# Patient Record
Sex: Female | Born: 1985 | Race: White | Hispanic: No | Smoking: Former smoker
Health system: Southern US, Community
[De-identification: ages and names within clinical notes are randomized; demographics above are authoritative.]

## PROBLEM LIST (undated history)

## (undated) DIAGNOSIS — H905 Unspecified sensorineural hearing loss: Secondary | ICD-10-CM

## (undated) DIAGNOSIS — F909 Attention-deficit hyperactivity disorder, unspecified type: Secondary | ICD-10-CM

## (undated) DIAGNOSIS — F329 Major depressive disorder, single episode, unspecified: Secondary | ICD-10-CM

## (undated) DIAGNOSIS — F32A Depression, unspecified: Secondary | ICD-10-CM

## (undated) HISTORY — DX: Major depressive disorder, single episode, unspecified: F32.9

## (undated) HISTORY — PX: KIDNEY SURGERY: SHX687

## (undated) HISTORY — DX: Depression, unspecified: F32.A

---
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
## 2005-01-17 ENCOUNTER — Emergency Department (HOSPITAL_COMMUNITY): Admission: EM | Admit: 2005-01-17 | Discharge: 2005-01-17 | Payer: Self-pay | Admitting: Emergency Medicine

## 2005-12-04 ENCOUNTER — Ambulatory Visit (HOSPITAL_COMMUNITY): Admission: RE | Admit: 2005-12-04 | Discharge: 2005-12-04 | Payer: Self-pay | Admitting: Orthopaedic Surgery

## 2005-12-26 ENCOUNTER — Other Ambulatory Visit: Admission: RE | Admit: 2005-12-26 | Discharge: 2005-12-26 | Payer: Self-pay | Admitting: Gynecology

## 2006-07-09 ENCOUNTER — Inpatient Hospital Stay (HOSPITAL_COMMUNITY): Admission: AD | Admit: 2006-07-09 | Discharge: 2006-07-09 | Payer: Self-pay | Admitting: Obstetrics and Gynecology

## 2006-07-10 ENCOUNTER — Inpatient Hospital Stay (HOSPITAL_COMMUNITY): Admission: AD | Admit: 2006-07-10 | Discharge: 2006-07-14 | Payer: Self-pay | Admitting: Obstetrics and Gynecology

## 2007-01-24 IMAGING — RF DG FL GUIDE NDL PLMT  - NRPT [facility]
1 series · 1 of 1 positions shown · IV contrast (gadolinium)
Comparison: None.

<!--  IDXRADR:ADDEND:BEGIN -->Addendum Begins<!--  IDXRADR:ADDEND:INNER_BEGIN -->Original report by Dr. Kimo.  Following addendum by Dr. Kimo on 12/04/05:
 ADDENDUM:  I inadvertently stated that the contrast injection was into the right shoulder.  It was indeed into the patient?s left shoulder which is the shoulder that was dislocated at the time of her injury.  This was confirmed by asking the patient which shoulder was injured immediately prior to the contrast injection.  Therefore, each place where I stated right shoulder should indeed be left shoulder.  

 <!--  IDXRADR:ADDEND:INNER_END -->Addendum Ends
<!--  IDXRADR:ADDEND:END -->Clinical Data: History of right shoulder dislocation when snowboarding
approximately one week ago.
RIGHT SHOULDER INJECTION PRIOR TO MRI 12/04/2005:
TECHNIQUE: Informed consent was obtained from the patient prior to the
procedure. Using the usual sterile technique and 1% lidocaine as local
anesthetic, a 22-gauge needle was advanced into the right shoulder joint under
fluoroscopic guidance. A mixture of 0.1 cc gadolinium with 10 cc 1mnipaque-2YY
were administered into the joint. Spot film image was obtained for
documentation.

[Series 1: run · 1 of 1 slices shown]
[im 1/1]
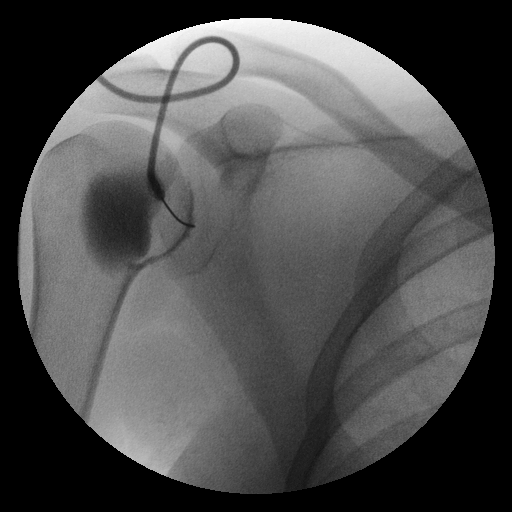

[1 of 1 positions shown; findings below may reference images not displayed]

IMPRESSION: Right shoulder joint contrast injection prior to MRI.

## 2015-03-31 ENCOUNTER — Emergency Department (HOSPITAL_BASED_OUTPATIENT_CLINIC_OR_DEPARTMENT_OTHER)
Admission: EM | Admit: 2015-03-31 | Discharge: 2015-03-31 | Disposition: A | Discharge status: Home / Self Care | Payer: BLUE CROSS/BLUE SHIELD | Attending: Emergency Medicine | Admitting: Emergency Medicine

## 2015-03-31 ENCOUNTER — Encounter (HOSPITAL_BASED_OUTPATIENT_CLINIC_OR_DEPARTMENT_OTHER): Payer: Self-pay

## 2015-03-31 ENCOUNTER — Emergency Department (HOSPITAL_BASED_OUTPATIENT_CLINIC_OR_DEPARTMENT_OTHER): Payer: BLUE CROSS/BLUE SHIELD

## 2015-03-31 DIAGNOSIS — R52 Pain, unspecified: Secondary | ICD-10-CM

## 2015-03-31 DIAGNOSIS — Z720 Tobacco use: Secondary | ICD-10-CM | POA: Insufficient documentation

## 2015-03-31 DIAGNOSIS — Z3202 Encounter for pregnancy test, result negative: Secondary | ICD-10-CM | POA: Insufficient documentation

## 2015-03-31 DIAGNOSIS — N8320 Unspecified ovarian cysts: Secondary | ICD-10-CM | POA: Insufficient documentation

## 2015-03-31 DIAGNOSIS — N939 Abnormal uterine and vaginal bleeding, unspecified: Secondary | ICD-10-CM | POA: Diagnosis not present

## 2015-03-31 DIAGNOSIS — F909 Attention-deficit hyperactivity disorder, unspecified type: Secondary | ICD-10-CM | POA: Insufficient documentation

## 2015-03-31 DIAGNOSIS — N83201 Unspecified ovarian cyst, right side: Secondary | ICD-10-CM

## 2015-03-31 DIAGNOSIS — N83209 Unspecified ovarian cyst, unspecified side: Secondary | ICD-10-CM

## 2015-03-31 DIAGNOSIS — R1031 Right lower quadrant pain: Secondary | ICD-10-CM | POA: Diagnosis present

## 2015-03-31 HISTORY — DX: Attention-deficit hyperactivity disorder, unspecified type: F90.9

## 2015-03-31 LAB — COMPREHENSIVE METABOLIC PANEL
ALT: 18 U/L (ref 14–54)
AST: 26 U/L (ref 15–41)
Albumin: 4.4 g/dL (ref 3.5–5.0)
Alkaline Phosphatase: 71 U/L (ref 38–126)
Anion gap: 10 (ref 5–15)
BUN: 13 mg/dL (ref 6–20)
CO2: 24 mmol/L (ref 22–32)
Calcium: 9.5 mg/dL (ref 8.9–10.3)
Chloride: 103 mmol/L (ref 101–111)
Creatinine, Ser: 0.81 mg/dL (ref 0.44–1.00)
GFR calc Af Amer: >60 mL/min (ref >60)
GFR calc non Af Amer: >60 mL/min (ref >60)
Glucose, Bld: 99 mg/dL (ref 65–99)
Potassium: 3.7 mmol/L (ref 3.5–5.1)
Sodium: 137 mmol/L (ref 135–145)
Total Bilirubin: 0.4 mg/dL (ref 0.3–1.2)
Total Protein: 8.1 g/dL (ref 6.5–8.1)

## 2015-03-31 LAB — URINALYSIS, ROUTINE W REFLEX MICROSCOPIC
Bilirubin Urine: NEGATIVE
Glucose, UA: NEGATIVE mg/dL
Ketones, ur: NEGATIVE mg/dL
Leukocytes, UA: NEGATIVE
Nitrite: NEGATIVE
Protein, ur: NEGATIVE mg/dL
Specific Gravity, Urine: 1.025 (ref 1.005–1.030)
Urobilinogen, UA: 0.2 mg/dL (ref 0.0–1.0)
pH: 5.5 (ref 5.0–8.0)

## 2015-03-31 LAB — WET PREP, GENITAL
Trich, Wet Prep: NONE SEEN
Yeast Wet Prep HPF POC: NONE SEEN

## 2015-03-31 LAB — URINE MICROSCOPIC-ADD ON

## 2015-03-31 LAB — CBC
HCT: 40.8 % (ref 36.0–46.0)
Hemoglobin: 13.7 g/dL (ref 12.0–15.0)
MCH: 28.2 pg (ref 26.0–34.0)
MCHC: 33.6 g/dL (ref 30.0–36.0)
MCV: 84 fL (ref 78.0–100.0)
Platelets: 293 10*3/uL (ref 150–400)
RBC: 4.86 MIL/uL (ref 3.87–5.11)
RDW: 13.3 % (ref 11.5–15.5)
WBC: 6.4 10*3/uL (ref 4.0–10.5)

## 2015-03-31 LAB — LIPASE, BLOOD: Lipase: 45 U/L (ref 22–51)

## 2015-03-31 LAB — PREGNANCY, URINE: Preg Test, Ur: NEGATIVE

## 2015-03-31 NOTE — ED Provider Notes (Signed)
CSN: [id number]     Arrival date & time 03/31/15  1336 History   First MD Initiated Contact with Patient 03/31/15 1400     Chief Complaint  Patient presents with  . Abdominal Pain     (Consider location/radiation/quality/duration/timing/severity/associated sxs/prior Treatment) HPI Comments: 29 year old female complaining of gradual onset right lower quadrant pain beginning earlier this morning while she was laying in bed. Pain has remained constant, worse when she relieves pressure from the area or moves in certain ways. No alleviating factors. Reports yesterday while playing volleyball, she was experiencing bilateral side pain which has since eased off. Admits to nausea without vomiting. Had an episode of loose stool earlier today. Reports a small amount of dark, red blood vaginal spotting beginning yesterday despite LMP 03/18/2015, only lasting a few days. Denies fevers or any urinary symptoms. Family history of Crohn's and kidney stones. Sexually active with her husband, not on birth control.  Patient is a 29 y.o. female presenting with abdominal pain. The history is provided by the patient and the spouse.  Abdominal Pain Associated symptoms: nausea and vaginal bleeding     Past Medical History  Diagnosis Date  . ADHD (attention deficit hyperactivity disorder)    Past Surgical History  Procedure Laterality Date  . Cesarean section     No family history on file. History  Substance Use Topics  . Smoking status: Current Every Day Smoker  . Smokeless tobacco: Not on file  . Alcohol Use: Yes   OB History    No data available     Review of Systems  Gastrointestinal: Positive for nausea and abdominal pain.       + Loose stool.  Genitourinary: Positive for vaginal bleeding.  Musculoskeletal:       + BL side pain.  All other systems reviewed and are negative.     Allergies  Erythromycin  Home Medications   Prior to Admission medications   Medication Sig Start Date End  Date Taking? Authorizing Provider  Amphetamine-Dextroamphetamine (ADDERALL PO) Take by mouth.   Yes Historical Provider, MD   BP 114/69 mmHg  Pulse 72  Temp(Src) 98.1 F (36.7 C) (Oral)  Resp 18  Ht 5\' 7"  (1.702 m)  Wt 180 lb (81.647 kg)  BMI 28.19 kg/m2  SpO2 98%  LMP 03/18/2015 Physical Exam  Constitutional: She is oriented to person, place, and time. She appears well-developed and well-nourished. No distress.  HENT:  Head: Normocephalic and atraumatic.  Mouth/Throat: Oropharynx is clear and moist.  Eyes: Conjunctivae and EOM are normal.  Neck: Normal range of motion. Neck supple.  Cardiovascular: Normal rate, regular rhythm and normal heart sounds.   Pulmonary/Chest: Effort normal and breath sounds normal. No respiratory distress.  Abdominal: Soft. Normal appearance and bowel sounds are normal. She exhibits no distension. There is tenderness in the right upper quadrant, right lower quadrant and epigastric area. There is guarding. There is no rigidity, no rebound, no CVA tenderness, no tenderness at McBurney's point and negative Murphy's sign.  Tenderness moreso RLQ.  Genitourinary: Uterus is tender. Cervix exhibits no motion tenderness, no discharge and no friability. Right adnexum displays tenderness. Left adnexum displays no tenderness. There is bleeding in the vagina. No erythema or tenderness in the vagina. No vaginal discharge found.  Musculoskeletal: Normal range of motion. She exhibits no edema.  Neurological: She is alert and oriented to person, place, and time. No sensory deficit.  Skin: Skin is warm and dry.  Psychiatric: She has a normal mood and affect.  Her behavior is normal.  Nursing note and vitals reviewed.   ED Course  Procedures (including critical care time) Labs Review Labs Reviewed  WET PREP, GENITAL - Abnormal; Notable for the following:    Clue Cells Wet Prep HPF POC FEW (*)    WBC, Wet Prep HPF POC FEW (*)    All other components within normal limits   URINALYSIS, ROUTINE W REFLEX MICROSCOPIC (NOT AT [facility]) - Abnormal; Notable for the following:    Hgb urine dipstick LARGE (*)    All other components within normal limits  URINE MICROSCOPIC-ADD ON - Abnormal; Notable for the following:    Bacteria, UA MANY (*)    All other components within normal limits  PREGNANCY, URINE  CBC  COMPREHENSIVE METABOLIC PANEL  LIPASE, BLOOD  GC/CHLAMYDIA PROBE AMP ([place]) NOT AT [facility]    Imaging Review US Transvaginal Non-ob  03/31/2015   CLINICAL DATA:  Right lower quadrant pain since 6 a.m. Intermittent vaginal bleeding. Nausea.  EXAM: TRANSABDOMINAL AND TRANSVAGINAL ULTRASOUND OF PELVIS  TECHNIQUE: Both transabdominal and transvaginal ultrasound examinations of the pelvis were performed. Transabdominal technique was performed for global imaging of the pelvis including uterus, ovaries, adnexal regions, and pelvic cul-de-sac. It was necessary to proceed with endovaginal exam following the transabdominal exam to visualize the uterus, ovaries, and adnexa.  COMPARISON:  None  FINDINGS: Uterus  Measurements: 8.2 x 4.4 x 5.8 cm. Retroverted. Otherwise normal in morphology. Incidental note is made of a 1.4 cm nabothian cyst.  Endometrium  Thickness: Normal, 16 mm.  Right ovary  Measurements: 3.8 x 3.0 x 2.4 cm. cystic lesion within is minimally irregular, measuring 2.1 x 1.8 x 1.6 cm. Has a suggestion of peripheral hypervascularity on image 67.  Left ovary  Measurements: 4.2 x 1.2 x 1.8 cm.  Normal in morphology.  Other findings  Small amount of fluid is identified adjacent the right ovary.  IMPRESSION: 1. 2.1 cm right ovarian lesion is likely a corpus luteal cyst. Fluid adjacent the right ovary suggests recent cyst rupture. 2. Otherwise, normal pelvic ultrasound for age.   Electronically Signed   By: Jeronimo Greaves M.D.   On: 03/31/2015 16:57   US Pelvis Complete  03/31/2015   CLINICAL DATA:  Right lower quadrant pain since 6 a.m. Intermittent vaginal bleeding.  Nausea.  EXAM: TRANSABDOMINAL AND TRANSVAGINAL ULTRASOUND OF PELVIS  TECHNIQUE: Both transabdominal and transvaginal ultrasound examinations of the pelvis were performed. Transabdominal technique was performed for global imaging of the pelvis including uterus, ovaries, adnexal regions, and pelvic cul-de-sac. It was necessary to proceed with endovaginal exam following the transabdominal exam to visualize the uterus, ovaries, and adnexa.  COMPARISON:  None  FINDINGS: Uterus  Measurements: 8.2 x 4.4 x 5.8 cm. Retroverted. Otherwise normal in morphology. Incidental note is made of a 1.4 cm nabothian cyst.  Endometrium  Thickness: Normal, 16 mm.  Right ovary  Measurements: 3.8 x 3.0 x 2.4 cm. cystic lesion within is minimally irregular, measuring 2.1 x 1.8 x 1.6 cm. Has a suggestion of peripheral hypervascularity on image 67.  Left ovary  Measurements: 4.2 x 1.2 x 1.8 cm.  Normal in morphology.  Other findings  Small amount of fluid is identified adjacent the right ovary.  IMPRESSION: 1. 2.1 cm right ovarian lesion is likely a corpus luteal cyst. Fluid adjacent the right ovary suggests recent cyst rupture. 2. Otherwise, normal pelvic ultrasound for age.   Electronically Signed   By: Jeronimo Greaves M.D.   On: 03/31/2015 16:57  EKG Interpretation None      MDM   Final diagnoses:  Ruptured ovarian cyst  Right ovarian cyst  Vaginal bleeding   Nontoxic appearing, NAD. AF VSS. Abdomen is soft with no peritoneal signs. Tenderness in right lower quadrant, right upper quadrant and epigastric. Negative Murphy's sign. Labs normal. UA with large amount of blood, however she has vaginal bleeding. No CMT. Uterine and right adnexal tenderness. Pelvic ultrasound obtained to evaluate the right adnexal pain. Right ovarian cyst and free fluid adjacent to the right ovary suggesting recent cyst rupture. This is the most likely cause of patient's pain. Patient is refusing any medication in the ED as her pain is slowly easing  off. Doubt appendicitis. I advised follow-up with her OB/GYN. Stable for discharge. Return precautions given. Patient states understanding of treatment care plan and is agreeable.  Kathrynn Speed, PA-C 03/31/15 1725  Purvis Sheffield, MD 04/01/15 [phone]

## 2015-03-31 NOTE — ED Notes (Addendum)
RLQ pain started this am-nausea-diarrhea yesterday-LMP 5/14 however vaginal bleeding started again yesterday

## 2015-03-31 NOTE — Discharge Instructions (Signed)
Follow up with your ob/gyn.  Ovarian Cyst An ovarian cyst is a fluid-filled sac that forms on an ovary. The ovaries are small organs that produce eggs in women. Various types of cysts can form on the ovaries. Most are not cancerous. Many do not cause problems, and they often go away on their own. Some may cause symptoms and require treatment. Common types of ovarian cysts include:  Functional cysts--These cysts may occur every month during the menstrual cycle. This is normal. The cysts usually go away with the next menstrual cycle if the woman does not get pregnant. Usually, there are no symptoms with a functional cyst.  Endometrioma cysts--These cysts form from the tissue that lines the uterus. They are also called "chocolate cysts" because they become filled with blood that turns brown. This type of cyst can cause pain in the lower abdomen during intercourse and with your menstrual period.  Cystadenoma cysts--This type develops from the cells on the outside of the ovary. These cysts can get very big and cause lower abdomen pain and pain with intercourse. This type of cyst can twist on itself, cut off its blood supply, and cause severe pain. It can also easily rupture and cause a lot of pain.  Dermoid cysts--This type of cyst is sometimes found in both ovaries. These cysts may contain different kinds of body tissue, such as skin, teeth, hair, or cartilage. They usually do not cause symptoms unless they get very big.  Theca lutein cysts--These cysts occur when too much of a certain hormone (human chorionic gonadotropin) is produced and overstimulates the ovaries to produce an egg. This is most common after procedures used to assist with the conception of a baby (in vitro fertilization). CAUSES   Fertility drugs can cause a condition in which multiple large cysts are formed on the ovaries. This is called ovarian hyperstimulation syndrome.  A condition called polycystic ovary syndrome can cause  hormonal imbalances that can lead to nonfunctional ovarian cysts. SIGNS AND SYMPTOMS  Many ovarian cysts do not cause symptoms. If symptoms are present, they may include:  Pelvic pain or pressure.  Pain in the lower abdomen.  Pain during sexual intercourse.  Increasing girth (swelling) of the abdomen.  Abnormal menstrual periods.  Increasing pain with menstrual periods.  Stopping having menstrual periods without being pregnant. DIAGNOSIS  These cysts are commonly found during a routine or annual pelvic exam. Tests may be ordered to find out more about the cyst. These tests may include:  Ultrasound.  X-ray of the pelvis.  CT scan.  MRI.  Blood tests. TREATMENT  Many ovarian cysts go away on their own without treatment. Your health care provider may want to check your cyst regularly for 2-3 months to see if it changes. For women in menopause, it is particularly important to monitor a cyst closely because of the higher rate of ovarian cancer in menopausal women. When treatment is needed, it may include any of the following:  A procedure to drain the cyst (aspiration). This may be done using a long needle and ultrasound. It can also be done through a laparoscopic procedure. This involves using a thin, lighted tube with a tiny camera on the end (laparoscope) inserted through a small incision.  Surgery to remove the whole cyst. This may be done using laparoscopic surgery or an open surgery involving a larger incision in the lower abdomen.  Hormone treatment or birth control pills. These methods are sometimes used to help dissolve a cyst. HOME CARE  INSTRUCTIONS   Only take over-the-counter or prescription medicines as directed by your health care provider.  Follow up with your health care provider as directed.  Get regular pelvic exams and Pap tests. SEEK MEDICAL CARE IF:   Your periods are late, irregular, or painful, or they stop.  Your pelvic pain or abdominal pain does  not go away.  Your abdomen becomes larger or swollen.  You have pressure on your bladder or trouble emptying your bladder completely.  You have pain during sexual intercourse.  You have feelings of fullness, pressure, or discomfort in your stomach.  You lose weight for no apparent reason.  You feel generally ill.  You become constipated.  You lose your appetite.  You develop acne.  You have an increase in body and facial hair.  You are gaining weight, without changing your exercise and eating habits.  You think you are pregnant. SEEK IMMEDIATE MEDICAL CARE IF:   You have increasing abdominal pain.  You feel sick to your stomach (nauseous), and you throw up (vomit).  You develop a fever that comes on suddenly.  You have abdominal pain during a bowel movement.  Your menstrual periods become heavier than usual. MAKE SURE YOU:  Understand these instructions.  Will watch your condition.  Will get help right away if you are not doing well or get worse. Document Released: 10/21/2005 Document Revised: 10/26/2013 Document Reviewed: 06/28/2013 [facility] Patient Information 2015 [place], [place]. This information is not intended to replace advice given to you by your health care provider. Make sure you discuss any questions you have with your health care provider.  Abnormal Uterine Bleeding Abnormal uterine bleeding can affect women at various stages in life, including teenagers, women in their reproductive years, pregnant women, and women who have reached menopause. Several kinds of uterine bleeding are considered abnormal, including:  Bleeding or spotting between periods.   Bleeding after sexual intercourse.   Bleeding that is heavier or more than normal.   Periods that last longer than usual.  Bleeding after menopause.  Many cases of abnormal uterine bleeding are minor and simple to treat, while others are more serious. Any type of abnormal bleeding should be  evaluated by your health care provider. Treatment will depend on the cause of the bleeding. HOME CARE INSTRUCTIONS Monitor your condition for any changes. The following actions may help to alleviate any discomfort you are experiencing:  Avoid the use of tampons and douches as directed by your health care provider.  Change your pads frequently. You should get regular pelvic exams and Pap tests. Keep all follow-up appointments for diagnostic tests as directed by your health care provider.  SEEK MEDICAL CARE IF:   Your bleeding lasts more than 1 week.   You feel dizzy at times.  SEEK IMMEDIATE MEDICAL CARE IF:   You pass out.   You are changing pads every 15 to 30 minutes.   You have abdominal pain.  You have a fever.   You become sweaty or weak.   You are passing large blood clots from the vagina.   You start to feel nauseous and vomit. MAKE SURE YOU:   Understand these instructions.  Will watch your condition.  Will get help right away if you are not doing well or get worse. Document Released: 10/21/2005 Document Revised: 10/26/2013 Document Reviewed: 05/20/2013 [facility]ExitCare Patient Information 2015 [place]. This information is not intended to replace advice given to you by your health care provider. Make sure you discuss any  questions you have with your health care provider. ° °

## 2015-04-04 LAB — GC/CHLAMYDIA PROBE AMP (~~LOC~~) NOT AT ARMC
Chlamydia: NEGATIVE
Neisseria Gonorrhea: NEGATIVE

## 2016-02-09 DIAGNOSIS — B351 Tinea unguium: Secondary | ICD-10-CM | POA: Diagnosis not present

## 2016-02-09 DIAGNOSIS — F900 Attention-deficit hyperactivity disorder, predominantly inattentive type: Secondary | ICD-10-CM | POA: Diagnosis not present

## 2016-04-09 DIAGNOSIS — Z130 Encounter for screening for diseases of the blood and blood-forming organs and certain disorders involving the immune mechanism: Secondary | ICD-10-CM | POA: Diagnosis not present

## 2016-04-09 DIAGNOSIS — Z1389 Encounter for screening for other disorder: Secondary | ICD-10-CM | POA: Diagnosis not present

## 2016-04-09 DIAGNOSIS — Z01419 Encounter for gynecological examination (general) (routine) without abnormal findings: Secondary | ICD-10-CM | POA: Diagnosis not present

## 2016-04-09 DIAGNOSIS — Z1151 Encounter for screening for human papillomavirus (HPV): Secondary | ICD-10-CM | POA: Diagnosis not present

## 2016-04-09 DIAGNOSIS — Z113 Encounter for screening for infections with a predominantly sexual mode of transmission: Secondary | ICD-10-CM | POA: Diagnosis not present

## 2016-04-09 DIAGNOSIS — Z124 Encounter for screening for malignant neoplasm of cervix: Secondary | ICD-10-CM | POA: Diagnosis not present

## 2016-04-09 DIAGNOSIS — Z6830 Body mass index (BMI) 30.0-30.9, adult: Secondary | ICD-10-CM | POA: Diagnosis not present

## 2016-04-09 DIAGNOSIS — F909 Attention-deficit hyperactivity disorder, unspecified type: Secondary | ICD-10-CM | POA: Insufficient documentation

## 2016-05-20 IMAGING — US US PELVIS COMPLETE
1 series · 13 of 25 positions shown · non-contrast
Comparison: None

CLINICAL DATA: Right lower quadrant pain since 6 a.m.. Intermittent
vaginal bleeding. Nausea.

EXAM:
TRANSABDOMINAL AND TRANSVAGINAL ULTRASOUND OF PELVIS
TECHNIQUE: Both transabdominal and transvaginal ultrasound examinations of the
pelvis were performed. Transabdominal technique was performed for
global imaging of the pelvis including uterus, ovaries, adnexal
regions, and pelvic cul-de-sac. It was necessary to proceed with
endovaginal exam following the transabdominal exam to visualize the
uterus, ovaries, and adnexa.

[Series 1: us pelvis complete · 0.22mm/px · 13 of 92 slices shown]
[im 1/92]
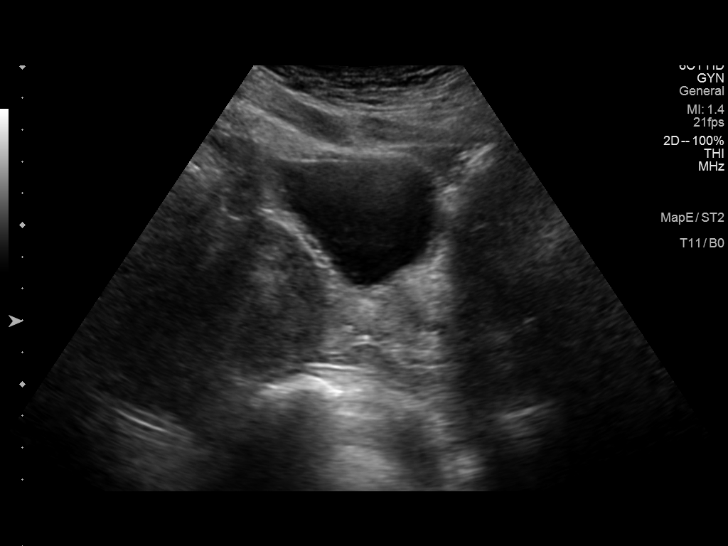
[im 8/92]
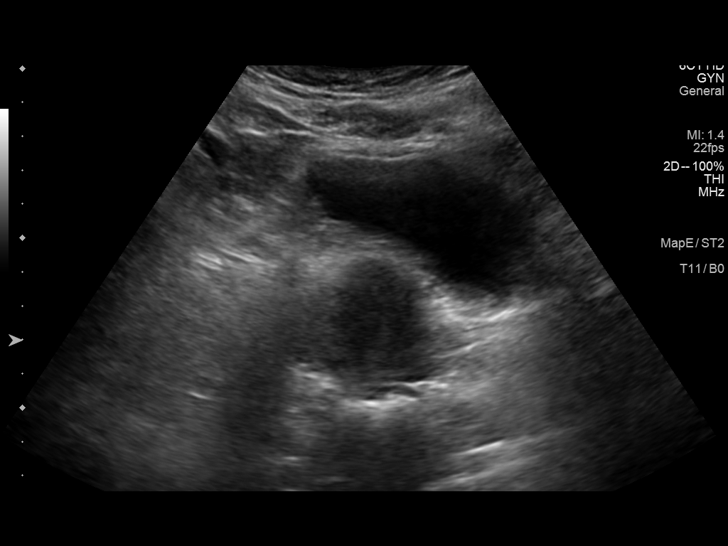
[im 16/92]
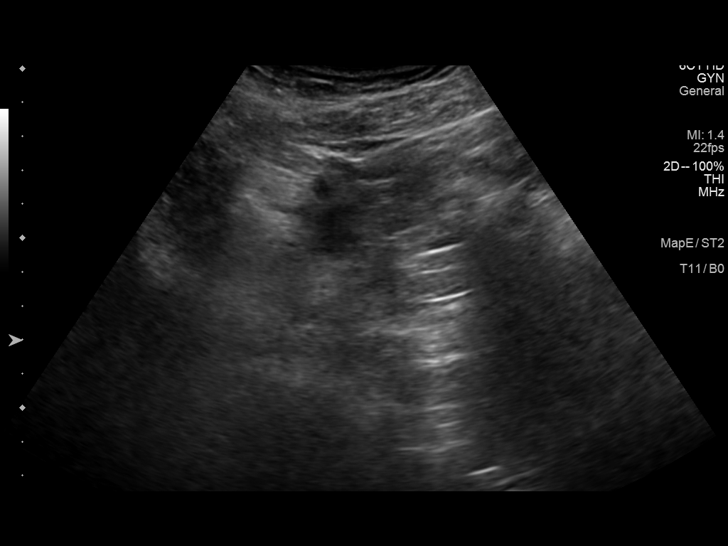
[im 23/92]
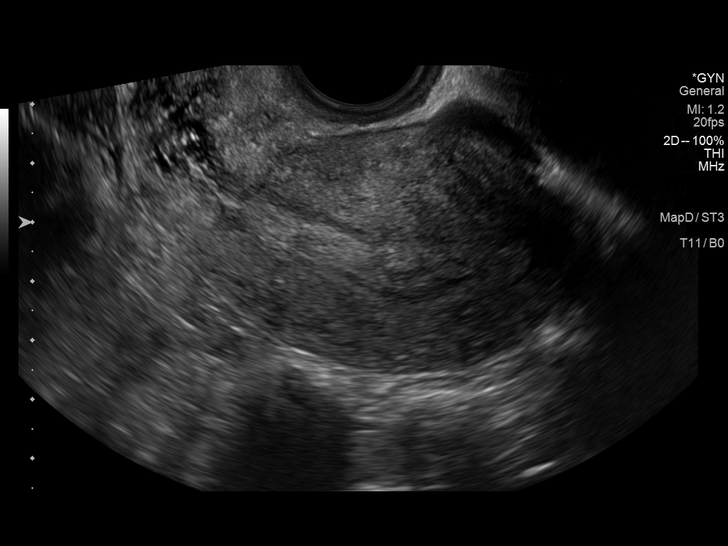
[im 31/92]
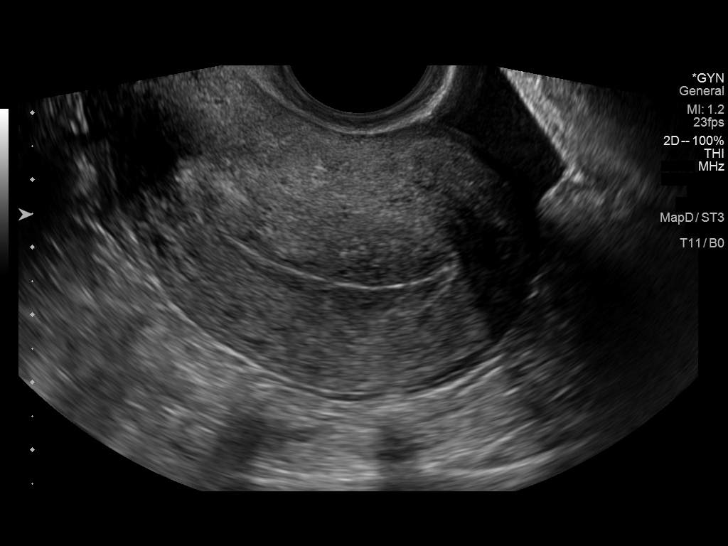
[im 38/92]
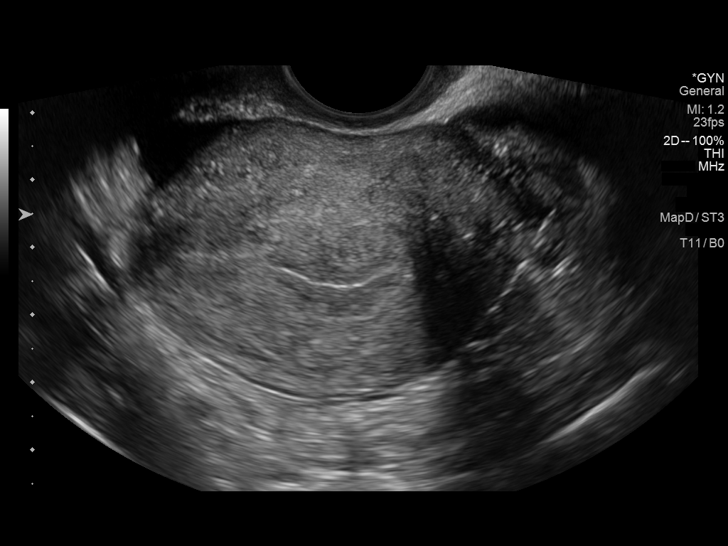
[im 46/92]
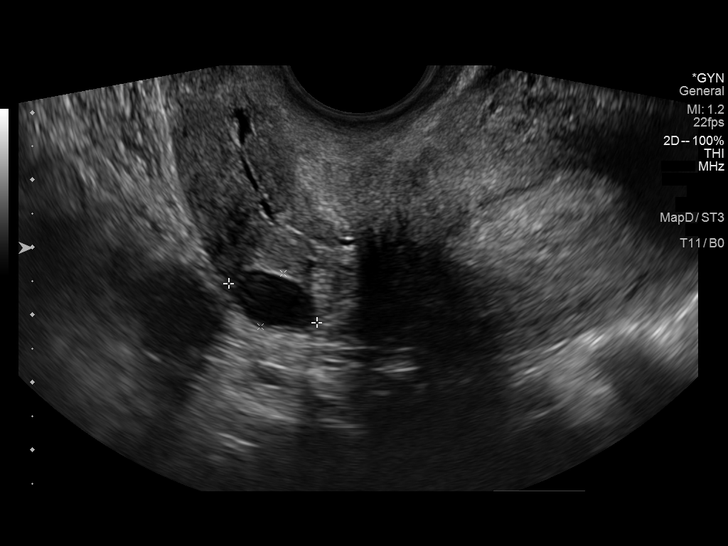
[im 54/92]
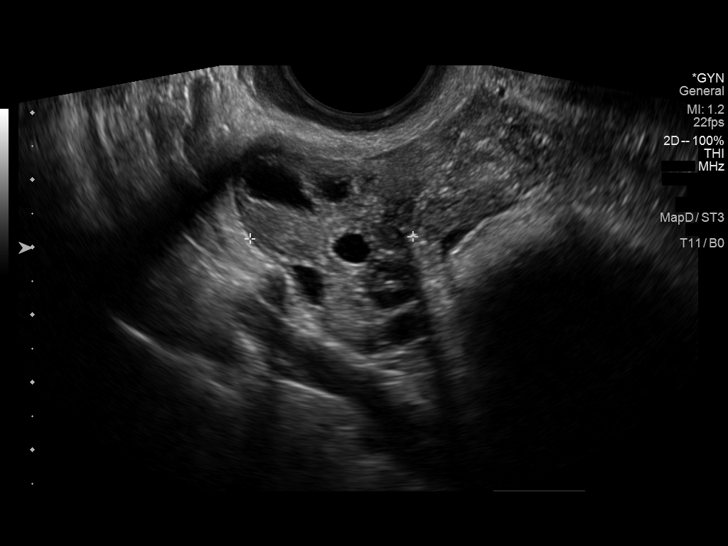
[im 61/92]
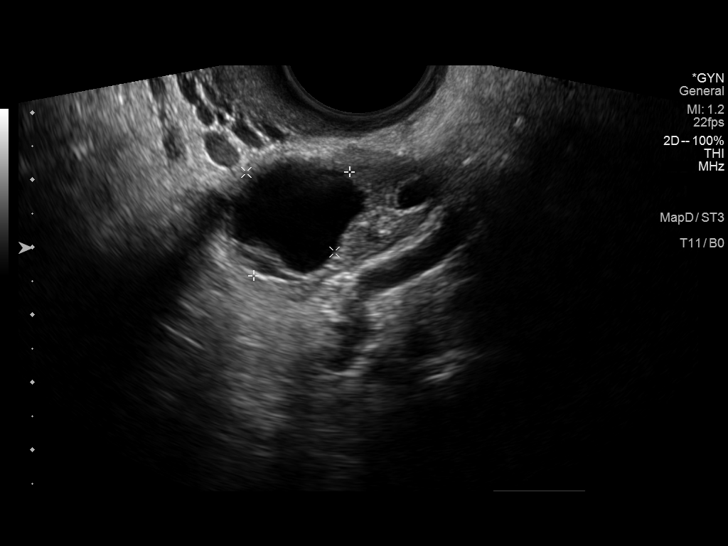
[im 69/92]
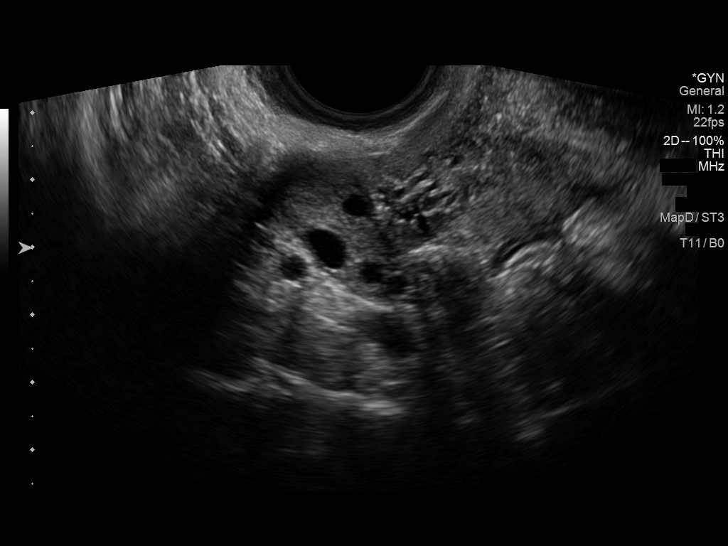
[im 76/92]
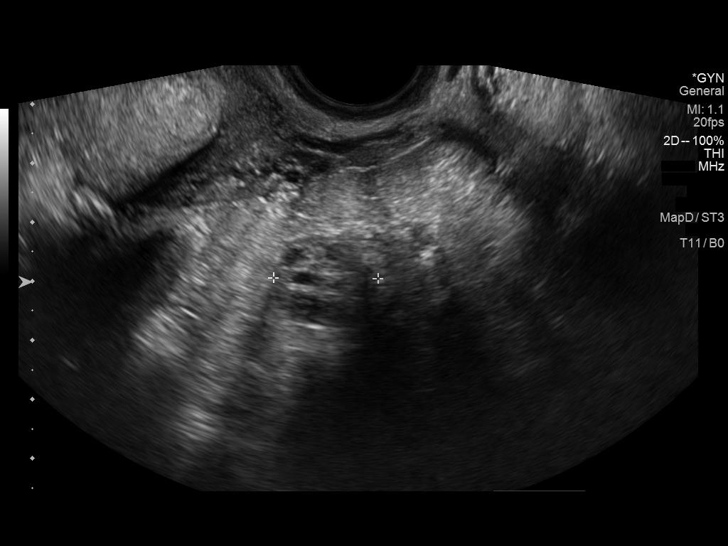
[im 84/92]
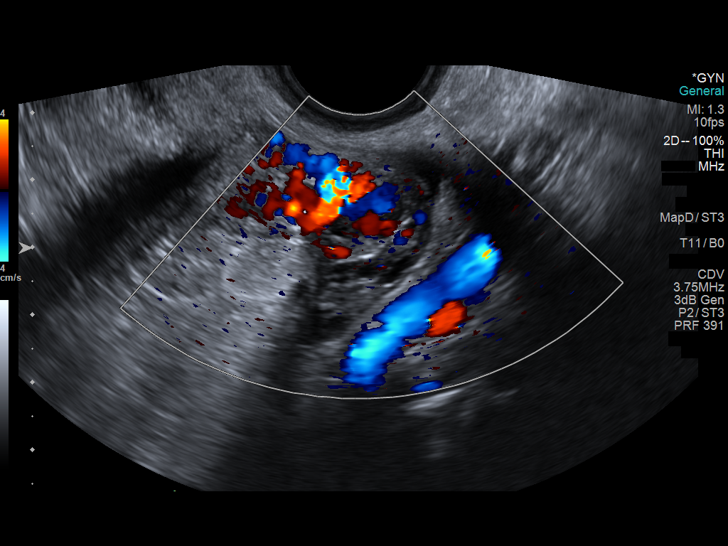
[im 92/92]
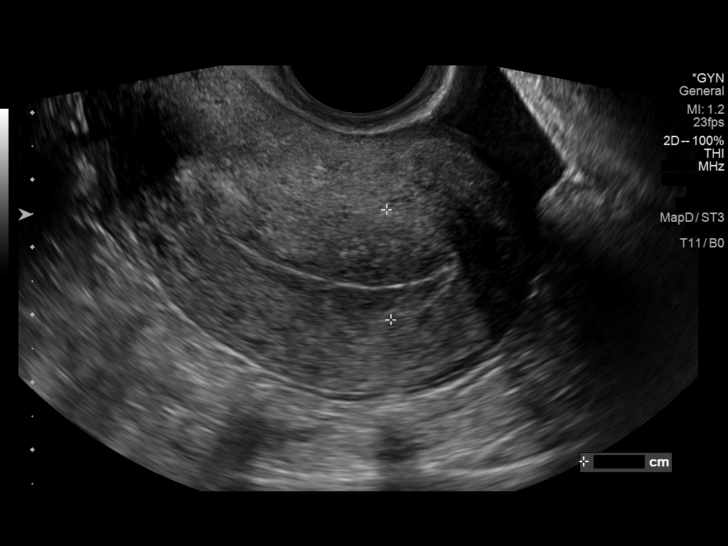

[13 of 25 positions shown; findings below may reference images not displayed]

FINDINGS: Uterus

Measurements: 8.2 x 4.4 x 5.8 cm.. Retroverted. Otherwise normal in
morphology. Incidental note is made of a 1.4 cm nabothian cyst.

Endometrium

Thickness: Normal, 16 mm.

Right ovary

Measurements: 3.8 x 3.0 x 2.4 cm. cystic lesion within is minimally
irregular, measuring 2.1 x 1.8 x 1.6 cm. Has a suggestion of
peripheral hypervascularity on image 67.

Left ovary

Measurements: 4.2 x 1.2 x 1.8 cm.  Normal in morphology.

Other findings

Small amount of fluid is identified adjacent the right ovary.
IMPRESSION: 1. 2.1 cm right ovarian lesion is likely a corpus luteal cyst. Fluid
adjacent the right ovary suggests recent cyst rupture.
2. Otherwise, normal pelvic ultrasound for age.

## 2016-08-13 DIAGNOSIS — F900 Attention-deficit hyperactivity disorder, predominantly inattentive type: Secondary | ICD-10-CM | POA: Diagnosis not present

## 2016-10-02 DIAGNOSIS — Z3201 Encounter for pregnancy test, result positive: Secondary | ICD-10-CM | POA: Diagnosis not present

## 2016-10-02 DIAGNOSIS — N911 Secondary amenorrhea: Secondary | ICD-10-CM | POA: Diagnosis not present

## 2016-10-21 DIAGNOSIS — O34211 Maternal care for low transverse scar from previous cesarean delivery: Secondary | ICD-10-CM | POA: Diagnosis not present

## 2016-10-21 DIAGNOSIS — Z368A Encounter for antenatal screening for other genetic defects: Secondary | ICD-10-CM | POA: Diagnosis not present

## 2016-10-21 DIAGNOSIS — Z3A09 9 weeks gestation of pregnancy: Secondary | ICD-10-CM | POA: Diagnosis not present

## 2016-10-21 DIAGNOSIS — O26891 Other specified pregnancy related conditions, first trimester: Secondary | ICD-10-CM | POA: Diagnosis not present

## 2016-10-21 DIAGNOSIS — O3429 Maternal care due to uterine scar from other previous surgery: Secondary | ICD-10-CM | POA: Insufficient documentation

## 2016-10-21 DIAGNOSIS — Z3689 Encounter for other specified antenatal screening: Secondary | ICD-10-CM | POA: Diagnosis not present

## 2016-10-21 DIAGNOSIS — Z3481 Encounter for supervision of other normal pregnancy, first trimester: Secondary | ICD-10-CM | POA: Diagnosis not present

## 2016-10-21 LAB — OB RESULTS CONSOLE ANTIBODY SCREEN: Antibody Screen: NEGATIVE

## 2016-10-21 LAB — OB RESULTS CONSOLE GC/CHLAMYDIA
Chlamydia: NEGATIVE
Gonorrhea: NEGATIVE

## 2016-10-21 LAB — OB RESULTS CONSOLE HIV ANTIBODY (ROUTINE TESTING): HIV: NONREACTIVE

## 2016-10-21 LAB — OB RESULTS CONSOLE ABO/RH: RH Type: POSITIVE

## 2016-10-21 LAB — OB RESULTS CONSOLE RUBELLA ANTIBODY, IGM: Rubella: IMMUNE

## 2016-10-21 LAB — OB RESULTS CONSOLE HEPATITIS B SURFACE ANTIGEN: Hepatitis B Surface Ag: NEGATIVE

## 2016-10-21 LAB — OB RESULTS CONSOLE RPR: RPR: NONREACTIVE

## 2016-11-04 NOTE — L&D Delivery Note (Signed)
Delivery Note At 5:34 PM a viable female was delivered via VBAC, Spontaneous (Presentation: vtx; OA).  APGAR: 9, 9; weight pending.   Placenta status: spontaneous, trailing membranes removed with ring forceps.  Cord:  with the following complications: none.  Anesthesia: Epidural  Episiotomy: None Lacerations:  2nd degree vaginal Suture Repair: 3.0 vicryl rapide Est. Blood Loss (mL):  300cc  Mom to postpartum.  Baby to Couplet care / Skin to Skin.  Leighton Roachodd D Kristen Fromm 05/23/2017, 6:02 PM

## 2016-11-15 DIAGNOSIS — Z3A13 13 weeks gestation of pregnancy: Secondary | ICD-10-CM | POA: Diagnosis not present

## 2016-11-15 DIAGNOSIS — O34211 Maternal care for low transverse scar from previous cesarean delivery: Secondary | ICD-10-CM | POA: Diagnosis not present

## 2016-11-15 DIAGNOSIS — Z3682 Encounter for antenatal screening for nuchal translucency: Secondary | ICD-10-CM | POA: Diagnosis not present

## 2016-12-25 DIAGNOSIS — Z3A18 18 weeks gestation of pregnancy: Secondary | ICD-10-CM | POA: Diagnosis not present

## 2016-12-25 DIAGNOSIS — Z363 Encounter for antenatal screening for malformations: Secondary | ICD-10-CM | POA: Diagnosis not present

## 2016-12-25 DIAGNOSIS — O34211 Maternal care for low transverse scar from previous cesarean delivery: Secondary | ICD-10-CM | POA: Diagnosis not present

## 2016-12-25 DIAGNOSIS — Z23 Encounter for immunization: Secondary | ICD-10-CM | POA: Diagnosis not present

## 2017-02-13 DIAGNOSIS — F900 Attention-deficit hyperactivity disorder, predominantly inattentive type: Secondary | ICD-10-CM | POA: Diagnosis not present

## 2017-02-20 DIAGNOSIS — Z3689 Encounter for other specified antenatal screening: Secondary | ICD-10-CM | POA: Diagnosis not present

## 2017-02-20 DIAGNOSIS — Z6791 Unspecified blood type, Rh negative: Secondary | ICD-10-CM | POA: Diagnosis not present

## 2017-02-20 DIAGNOSIS — Z3482 Encounter for supervision of other normal pregnancy, second trimester: Secondary | ICD-10-CM | POA: Diagnosis not present

## 2017-03-10 DIAGNOSIS — Z23 Encounter for immunization: Secondary | ICD-10-CM | POA: Diagnosis not present

## 2017-03-10 DIAGNOSIS — Z3A29 29 weeks gestation of pregnancy: Secondary | ICD-10-CM | POA: Diagnosis not present

## 2017-04-30 DIAGNOSIS — Z3685 Encounter for antenatal screening for Streptococcus B: Secondary | ICD-10-CM | POA: Diagnosis not present

## 2017-05-01 LAB — OB RESULTS CONSOLE GBS: GBS: NEGATIVE

## 2017-05-21 ENCOUNTER — Telehealth (HOSPITAL_COMMUNITY): Payer: Self-pay | Admitting: *Deleted

## 2017-05-21 ENCOUNTER — Encounter (HOSPITAL_COMMUNITY): Payer: Self-pay | Admitting: *Deleted

## 2017-05-21 NOTE — Telephone Encounter (Signed)
Preadmission screen  

## 2017-05-23 ENCOUNTER — Inpatient Hospital Stay (HOSPITAL_COMMUNITY): Payer: BLUE CROSS/BLUE SHIELD

## 2017-05-23 ENCOUNTER — Inpatient Hospital Stay (HOSPITAL_COMMUNITY): Payer: BLUE CROSS/BLUE SHIELD | Admitting: Anesthesiology

## 2017-05-23 ENCOUNTER — Inpatient Hospital Stay (HOSPITAL_COMMUNITY)
Admission: AD | Admit: 2017-05-23 | Discharge: 2017-05-25 | DRG: 775 | Disposition: A | Discharge status: Home / Self Care | Payer: BLUE CROSS/BLUE SHIELD | Source: Ambulatory Visit | Attending: Obstetrics and Gynecology | Admitting: Obstetrics and Gynecology

## 2017-05-23 ENCOUNTER — Encounter (HOSPITAL_COMMUNITY): Payer: Self-pay

## 2017-05-23 DIAGNOSIS — O480 Post-term pregnancy: Secondary | ICD-10-CM | POA: Diagnosis not present

## 2017-05-23 DIAGNOSIS — Z3A40 40 weeks gestation of pregnancy: Secondary | ICD-10-CM | POA: Diagnosis not present

## 2017-05-23 DIAGNOSIS — O403XX0 Polyhydramnios, third trimester, not applicable or unspecified: Secondary | ICD-10-CM | POA: Diagnosis not present

## 2017-05-23 DIAGNOSIS — O99334 Smoking (tobacco) complicating childbirth: Secondary | ICD-10-CM | POA: Diagnosis not present

## 2017-05-23 DIAGNOSIS — O34211 Maternal care for low transverse scar from previous cesarean delivery: Secondary | ICD-10-CM | POA: Diagnosis not present

## 2017-05-23 DIAGNOSIS — Z23 Encounter for immunization: Secondary | ICD-10-CM | POA: Diagnosis not present

## 2017-05-23 DIAGNOSIS — O288 Other abnormal findings on antenatal screening of mother: Secondary | ICD-10-CM | POA: Diagnosis not present

## 2017-05-23 LAB — CBC
HCT: 34.9 % — ABNORMAL LOW (ref 36.0–46.0)
Hemoglobin: 11.5 g/dL — ABNORMAL LOW (ref 12.0–15.0)
MCH: 27.6 pg (ref 26.0–34.0)
MCHC: 33 g/dL (ref 30.0–36.0)
MCV: 83.9 fL (ref 78.0–100.0)
Platelets: 299 10*3/uL (ref 150–400)
RBC: 4.16 MIL/uL (ref 3.87–5.11)
RDW: 14.3 % (ref 11.5–15.5)
WBC: 15.3 10*3/uL — ABNORMAL HIGH (ref 4.0–10.5)

## 2017-05-23 LAB — TYPE AND SCREEN
ABO/RH(D): B POS
Antibody Screen: NEGATIVE

## 2017-05-23 LAB — ABO/RH: ABO/RH(D): B POS

## 2017-05-23 MED ORDER — SENNOSIDES-DOCUSATE SODIUM 8.6-50 MG PO TABS
2.0000 | ORAL_TABLET | ORAL | Status: DC
  Administered 2017-05-25: 2 via ORAL
  Filled 2017-05-23 (×2): qty 2

## 2017-05-23 MED ORDER — METHYLERGONOVINE MALEATE 0.2 MG/ML IJ SOLN: 0.2000 mg | INTRAMUSCULAR | Status: DC | PRN

## 2017-05-23 MED ORDER — DIBUCAINE 1 % RE OINT: 1.0000 "application " | TOPICAL_OINTMENT | RECTAL | Status: DC | PRN

## 2017-05-23 MED ORDER — PHENYLEPHRINE 40 MCG/ML (10ML) SYRINGE FOR IV PUSH (FOR BLOOD PRESSURE SUPPORT)
80.0000 ug | PREFILLED_SYRINGE | INTRAVENOUS | Status: DC | PRN
  Administered 2017-05-23 (×2): 80 ug via INTRAVENOUS
  Filled 2017-05-23: qty 5

## 2017-05-23 MED ORDER — ACETAMINOPHEN 325 MG PO TABS: 650.0000 mg | ORAL_TABLET | ORAL | Status: DC | PRN

## 2017-05-23 MED ORDER — LACTATED RINGERS IV SOLN
INTRAVENOUS | Status: DC
  Administered 2017-05-23 (×3): via INTRAVENOUS

## 2017-05-23 MED ORDER — DIPHENHYDRAMINE HCL 50 MG/ML IJ SOLN
12.5000 mg | INTRAMUSCULAR | Status: DC | PRN
  Administered 2017-05-23 (×2): 12.5 mg via INTRAVENOUS
  Filled 2017-05-23: qty 1

## 2017-05-23 MED ORDER — METHYLERGONOVINE MALEATE 0.2 MG PO TABS: 0.2000 mg | ORAL_TABLET | ORAL | Status: DC | PRN

## 2017-05-23 MED ORDER — ONDANSETRON HCL 4 MG/2ML IJ SOLN: 4.0000 mg | INTRAMUSCULAR | Status: DC | PRN

## 2017-05-23 MED ORDER — LACTATED RINGERS IV SOLN: 500.0000 mL | Freq: Once | INTRAVENOUS | Status: DC

## 2017-05-23 MED ORDER — EPHEDRINE 5 MG/ML INJ
10.0000 mg | INTRAVENOUS | Status: DC | PRN
  Filled 2017-05-23: qty 2

## 2017-05-23 MED ORDER — ACETAMINOPHEN 325 MG PO TABS
650.0000 mg | ORAL_TABLET | ORAL | Status: DC | PRN
  Administered 2017-05-24 – 2017-05-25 (×2): 650 mg via ORAL
  Filled 2017-05-23 (×2): qty 2

## 2017-05-23 MED ORDER — LIDOCAINE HCL (PF) 1 % IJ SOLN
30.0000 mL | INTRAMUSCULAR | Status: DC | PRN
Start: 2017-05-23 — End: 2017-05-23
  Filled 2017-05-23: qty 30

## 2017-05-23 MED ORDER — WITCH HAZEL-GLYCERIN EX PADS: 1.0000 "application " | MEDICATED_PAD | CUTANEOUS | Status: DC | PRN

## 2017-05-23 MED ORDER — OXYCODONE HCL 5 MG PO TABS: 5.0000 mg | ORAL_TABLET | ORAL | Status: DC | PRN

## 2017-05-23 MED ORDER — FENTANYL 2.5 MCG/ML BUPIVACAINE 1/10 % EPIDURAL INFUSION (WH - ANES)
14.0000 mL/h | INTRAMUSCULAR | Status: DC | PRN
  Administered 2017-05-23: 14 mL/h via EPIDURAL
  Filled 2017-05-23: qty 100

## 2017-05-23 MED ORDER — SIMETHICONE 80 MG PO CHEW: 80.0000 mg | CHEWABLE_TABLET | ORAL | Status: DC | PRN

## 2017-05-23 MED ORDER — OXYCODONE HCL 5 MG PO TABS: 10.0000 mg | ORAL_TABLET | ORAL | Status: DC | PRN

## 2017-05-23 MED ORDER — OXYTOCIN 40 UNITS IN LACTATED RINGERS INFUSION - SIMPLE MED: 2.5000 [IU]/h | INTRAVENOUS | Status: DC

## 2017-05-23 MED ORDER — DIPHENHYDRAMINE HCL 25 MG PO CAPS: 25.0000 mg | ORAL_CAPSULE | Freq: Four times a day (QID) | ORAL | Status: DC | PRN

## 2017-05-23 MED ORDER — OXYCODONE-ACETAMINOPHEN 5-325 MG PO TABS: 2.0000 | ORAL_TABLET | ORAL | Status: DC | PRN

## 2017-05-23 MED ORDER — TERBUTALINE SULFATE 1 MG/ML IJ SOLN
0.2500 mg | Freq: Once | INTRAMUSCULAR | Status: DC | PRN
  Filled 2017-05-23: qty 1

## 2017-05-23 MED ORDER — MAGNESIUM HYDROXIDE 400 MG/5ML PO SUSP: 30.0000 mL | ORAL | Status: DC | PRN

## 2017-05-23 MED ORDER — LACTATED RINGERS IV SOLN: 500.0000 mL | INTRAVENOUS | Status: DC | PRN

## 2017-05-23 MED ORDER — MEASLES, MUMPS & RUBELLA VAC ~~LOC~~ INJ: 0.5000 mL | INJECTION | Freq: Once | SUBCUTANEOUS | Status: DC

## 2017-05-23 MED ORDER — LIDOCAINE HCL (PF) 1 % IJ SOLN
INTRAMUSCULAR | Status: DC | PRN
  Administered 2017-05-23 (×2): 5 mL via EPIDURAL

## 2017-05-23 MED ORDER — IBUPROFEN 600 MG PO TABS
600.0000 mg | ORAL_TABLET | Freq: Four times a day (QID) | ORAL | Status: DC
  Administered 2017-05-24 – 2017-05-25 (×6): 600 mg via ORAL
  Filled 2017-05-23 (×7): qty 1

## 2017-05-23 MED ORDER — BENZOCAINE-MENTHOL 20-0.5 % EX AERO: 1.0000 "application " | INHALATION_SPRAY | CUTANEOUS | Status: DC | PRN

## 2017-05-23 MED ORDER — OXYTOCIN BOLUS FROM INFUSION
500.0000 mL | Freq: Once | INTRAVENOUS | Status: AC
  Administered 2017-05-23: 500 mL via INTRAVENOUS

## 2017-05-23 MED ORDER — PHENYLEPHRINE 40 MCG/ML (10ML) SYRINGE FOR IV PUSH (FOR BLOOD PRESSURE SUPPORT)
80.0000 ug | PREFILLED_SYRINGE | INTRAVENOUS | Status: DC | PRN
  Filled 2017-05-23 (×2): qty 10
  Filled 2017-05-23: qty 5

## 2017-05-23 MED ORDER — ONDANSETRON HCL 4 MG PO TABS: 4.0000 mg | ORAL_TABLET | ORAL | Status: DC | PRN

## 2017-05-23 MED ORDER — ONDANSETRON HCL 4 MG/2ML IJ SOLN: 4.0000 mg | Freq: Four times a day (QID) | INTRAMUSCULAR | Status: DC | PRN

## 2017-05-23 MED ORDER — PRENATAL MULTIVITAMIN CH
1.0000 | ORAL_TABLET | Freq: Every day | ORAL | Status: DC
  Administered 2017-05-24 – 2017-05-25 (×2): 1 via ORAL
  Filled 2017-05-23 (×2): qty 1

## 2017-05-23 MED ORDER — BUTORPHANOL TARTRATE 1 MG/ML IJ SOLN
1.0000 mg | INTRAMUSCULAR | Status: DC | PRN
  Administered 2017-05-23: 1 mg via INTRAVENOUS
  Filled 2017-05-23: qty 1

## 2017-05-23 MED ORDER — TETANUS-DIPHTH-ACELL PERTUSSIS 5-2.5-18.5 LF-MCG/0.5 IM SUSP
0.5000 mL | Freq: Once | INTRAMUSCULAR | Status: DC
Start: 2017-05-24 — End: 2017-05-25

## 2017-05-23 MED ORDER — ZOLPIDEM TARTRATE 5 MG PO TABS: 5.0000 mg | ORAL_TABLET | Freq: Every evening | ORAL | Status: DC | PRN

## 2017-05-23 MED ORDER — COCONUT OIL OIL: 1.0000 "application " | TOPICAL_OIL | Status: DC | PRN

## 2017-05-23 MED ORDER — SOD CITRATE-CITRIC ACID 500-334 MG/5ML PO SOLN: 30.0000 mL | ORAL | Status: DC | PRN

## 2017-05-23 MED ORDER — OXYCODONE-ACETAMINOPHEN 5-325 MG PO TABS: 1.0000 | ORAL_TABLET | ORAL | Status: DC | PRN

## 2017-05-23 MED ORDER — OXYTOCIN 40 UNITS IN LACTATED RINGERS INFUSION - SIMPLE MED
1.0000 m[IU]/min | INTRAVENOUS | Status: DC
  Administered 2017-05-23: 2 m[IU]/min via INTRAVENOUS
  Filled 2017-05-23: qty 1000

## 2017-05-23 NOTE — H&P (Signed)
Cheyenne Alvarado is a 31 y.o. female, G2P1001, EGA [redacted] weeks with EDC today presenting for ctx.  On eval in [facility], irreg ctx and VE unchanged.  FHT was reassuring but not reactive, BPP 8/8 with AFI 24.  Options discussed, she elected for admission and augmentation of labor.  Prenatal care complicated by previous LTCS, CTOL and desires VBAC, consent signed.  OB History    Gravida Para Term Preterm AB Living   2 1 1     1    SAB TAB Ectopic Multiple Live Births           1     Past Medical History:  Diagnosis Date  . ADHD (attention deficit hyperactivity disorder)   . Depression    Past Surgical History:  Procedure Laterality Date  . CESAREAN SECTION    . KIDNEY SURGERY     Family History: family history is not on file. Social History:  reports that she has been smoking.  She does not have any smokeless tobacco history on file. She reports that she drinks alcohol. She reports that she does not use drugs.     Maternal Diabetes: No Genetic Screening: Normal Maternal Ultrasounds/Referrals: Normal Fetal Ultrasounds or other Referrals:  None Maternal Substance Abuse:  No Significant Maternal Medications:  None Significant Maternal Lab Results:  Lab values include: Group B Strep negative Other Comments:  None  Review of Systems  Respiratory: Negative.   Cardiovascular: Negative.    Maternal Medical History:  Reason for admission: Contractions.   Contractions: Frequency: irregular.   Perceived severity is moderate.    Fetal activity: Perceived fetal activity is normal.    Prenatal complications: no prenatal complications Prenatal Complications - Diabetes: none.    Dilation: (P) 4.5 Effacement (%): (P) 90 Station: (P) -1 Exam by:: (P) Cheyenne Alvarado Blood pressure (!) 101/54, pulse (!) 59, temperature 97.6 F (36.4 C), temperature source Oral, resp. rate 18, height 5\' 7"  (1.702 m), weight 92.1 kg (203 lb), last menstrual period 08/16/2016, SpO2 100 %. Maternal Exam:  Uterine  Assessment: Contraction strength is moderate.  Contraction frequency is regular.   Abdomen: Patient reports no abdominal tenderness. Surgical scars: low transverse.   Estimated fetal weight is 7 1/2 lbs.   Fetal presentation: vertex  Introitus: Normal vulva. Normal vagina.  Amniotic fluid character: clear.  Pelvis: adequate for delivery.   Cervix: Cervix evaluated by digital exam.     Fetal Exam Fetal Monitor Review: Mode: ultrasound.   Baseline rate: 140.  Variability: moderate (6-25 bpm).   Pattern: variable decelerations.    Fetal State Assessment: Category II - tracings are indeterminate.     Physical Exam  Vitals reviewed. Constitutional: She appears well-developed and well-nourished.  Cardiovascular: Normal rate and regular rhythm.   Respiratory: Effort normal. No respiratory distress.  GI: Soft.    Prenatal labs: ABO, Rh: --/--/B POS, B POS (07/20 0950) Antibody: NEG (07/20 0950) Rubella: Immune (12/18 0000) RPR: Nonreactive (12/18 0000)  HBsAg: Negative (12/18 0000)  HIV: Non-reactive (12/18 0000)  GBS: Negative (06/28 0000)   Assessment/Plan: IUP at 40 weeks admitted in latent labor with non-reactive NST, BPP 8/8 with polyhydramnios, previous LTCS for VBAC.  She was admitted and put on pitocin augmentation, has received epidural, AROM also done for augmentation.  FHT with some variable decels, mod variability.  Will monitor FHT and progress, anticipate VBAC.   Leighton Roachodd D Sammy Douthitt 05/23/2017, 12:36 PM

## 2017-05-23 NOTE — Anesthesia Procedure Notes (Signed)
Epidural Patient location during procedure: OB Start time: 05/23/2017 11:14 AM End time: 05/23/2017 11:29 AM  Staffing Anesthesiologist: Heather RobertsSINGER, Sherissa Tenenbaum Performed: anesthesiologist   Preanesthetic Checklist Completed: patient identified, site marked, pre-op evaluation, timeout performed, IV checked, risks and benefits discussed and monitors and equipment checked  Epidural Patient position: sitting Prep: DuraPrep Patient monitoring: heart rate, cardiac monitor, continuous pulse ox and blood pressure Approach: midline Location: L2-L3 Injection technique: LOR saline  Needle:  Needle type: Tuohy  Needle gauge: 17 G Needle length: 9 cm Needle insertion depth: 6 cm Catheter size: 20 Guage Catheter at skin depth: 10 cm Test dose: negative and Other  Assessment Events: blood not aspirated, injection not painful, no injection resistance and negative IV test  Additional Notes Informed consent obtained prior to proceeding including risk of failure, 1% risk of PDPH, risk of minor discomfort and bruising.  Discussed rare but serious complications including epidural abscess, permanent nerve injury, epidural hematoma.  Discussed alternatives to epidural analgesia and patient desires to proceed.  Timeout performed pre-procedure verifying patient name, procedure, and platelet count.  Patient tolerated procedure well.

## 2017-05-23 NOTE — Anesthesia Pain Management Evaluation Note (Signed)
  CRNA Pain Management Visit Note  Patient: Cheyenne PicklesBonnie C Bebee, 31 y.o., female  "Hello I am a member of the anesthesia team at [facility]. We have an anesthesia team available at all times to provide care throughout the hospital, including epidural management and anesthesia for C-section. I don't know your plan for the delivery whether it a natural birth, water birth, IV sedation, nitrous supplementation, doula or epidural, but we want to meet your pain goals."   1.Was your pain managed to your expectations on prior hospitalizations?   Yes   2.What is your expectation for pain management during this hospitalization?     Epidural  3.How can we help you reach that goal? epidural  Record the patient's initial score and the patient's pain goal.   Pain: 2  Pain Goal: 4 [facility] wants you to be able to say your pain was always managed very well.  Tane Biegler 05/23/2017

## 2017-05-23 NOTE — Anesthesia Preprocedure Evaluation (Signed)
Anesthesia Evaluation  Patient identified by MRN, date of birth, ID band Patient awake    Reviewed: Allergy & Precautions, NPO status , Patient's Chart, lab work & pertinent test results  Airway Mallampati: II  TM Distance: >3 FB Neck ROM: Full    Dental no notable dental hx. (+) Dental Advisory Given   Pulmonary Current Smoker,    Pulmonary exam normal        Cardiovascular negative cardio ROS Normal cardiovascular exam     Neuro/Psych PSYCHIATRIC DISORDERS Depression negative neurological ROS     GI/Hepatic negative GI ROS, Neg liver ROS,   Endo/Other  negative endocrine ROS  Renal/GU negative Renal ROS  negative genitourinary   Musculoskeletal negative musculoskeletal ROS (+)   Abdominal   Peds negative pediatric ROS (+)  Hematology negative hematology ROS (+)   Anesthesia Other Findings   Reproductive/Obstetrics (+) Pregnancy                             Anesthesia Physical Anesthesia Plan  ASA: II  Anesthesia Plan: Epidural   Post-op Pain Management:    Induction:   PONV Risk Score and Plan:   Airway Management Planned: Natural Airway  Additional Equipment:   Intra-op Plan:   Post-operative Plan:   Informed Consent: I have reviewed the patients History and Physical, chart, labs and discussed the procedure including the risks, benefits and alternatives for the proposed anesthesia with the patient or authorized representative who has indicated his/her understanding and acceptance.   Dental advisory given  Plan Discussed with: Anesthesiologist  Anesthesia Plan Comments:         Anesthesia Quick Evaluation

## 2017-05-23 NOTE — MAU Note (Signed)
Pt presents with contractions 

## 2017-05-24 ENCOUNTER — Encounter (HOSPITAL_COMMUNITY): Payer: Self-pay

## 2017-05-24 LAB — CBC
HCT: 28.8 % — ABNORMAL LOW (ref 36.0–46.0)
Hemoglobin: 9.9 g/dL — ABNORMAL LOW (ref 12.0–15.0)
MCH: 28.4 pg (ref 26.0–34.0)
MCHC: 34.4 g/dL (ref 30.0–36.0)
MCV: 82.5 fL (ref 78.0–100.0)
Platelets: 243 10*3/uL (ref 150–400)
RBC: 3.49 MIL/uL — ABNORMAL LOW (ref 3.87–5.11)
RDW: 14.2 % (ref 11.5–15.5)
WBC: 18.7 10*3/uL — ABNORMAL HIGH (ref 4.0–10.5)

## 2017-05-24 LAB — RPR: RPR Ser Ql: NONREACTIVE

## 2017-05-24 NOTE — Progress Notes (Signed)
Patient ID: Cheyenne Alvarado, female   DOB: 01/19/1986, 31 y.o.   MRN: [id number] PPD #1 No problems Afeb, VSS Fundus firm, NT at U-1 Continue routine postpartum care

## 2017-05-24 NOTE — Anesthesia Postprocedure Evaluation (Signed)
Anesthesia Post Note  Patient: Marvia PicklesBonnie C Bendix  Procedure(s) Performed: * No procedures listed *     Patient location during evaluation: Mother Baby Anesthesia Type: Epidural Level of consciousness: awake and alert and oriented Pain management: satisfactory to patient Vital Signs Assessment: post-procedure vital signs reviewed and stable Respiratory status: spontaneous breathing, respiratory function stable and nonlabored ventilation Cardiovascular status: blood pressure returned to baseline and stable Postop Assessment: no headache, no backache, epidural receding, patient able to bend at knees and no signs of nausea or vomiting Anesthetic complications: no Comments: Pt sitting in bed on room air oxygen interviewed as per above     Last Vitals:  Vitals:   05/24/17 0540 05/24/17 0753  BP: (!) 133/97 (!) 90/44  Pulse: 75 65  Resp: 16 20  Temp: 36.4 C 36.8 C    Last Pain:  Vitals:   05/24/17 0800  TempSrc:   PainSc: 4    Pain Goal: Patients Stated Pain Goal: 3 (05/24/17 0800)               Mauricia AreaWINN,Denham Mose

## 2017-05-24 NOTE — Lactation Note (Signed)
This note was copied from a baby's chart. Lactation Consultation Note New mom hasn't BF. Has 31 yr old adoptive son. Mom has everted nipples. Hand expression taught w/colostrum. Discussed feeding positions. Mom has cradle fed. Set up for football position, baby started spitting.  Noted baby's abd. Slightly distended. Mom stated baby had been spity.  Mom encouraged to feed baby 8-12 times/24 hours and with feeding cues. Mom encouraged to waken baby for feeds.  Educated mom on newborn behavior, feeding habits, I&O, STS, cluster feeding, supply and demand. Encouraged to call for assistance or questions. WH/LC brochure given w/resources, support groups and LC services. Patient Name: Cheyenne Alvarado WUJWJ'XToday's Date: 05/24/2017 Reason for consult: Initial assessment   Maternal Data Has patient been taught Hand Expression?: Yes Does the patient have breastfeeding experience prior to this delivery?: No  Feeding    LATCH Score/Interventions Latch: Too sleepy or reluctant, no latch achieved, no sucking elicited. Intervention(s): Teach feeding cues;Waking techniques Intervention(s): Adjust position;Assist with latch;Breast massage;Breast compression  Intervention(s): Hand expression;Skin to skin;Alternate breast massage  Type of Nipple: Everted at rest and after stimulation  Comfort (Breast/Nipple): Soft / non-tender  Interventions (Mild/moderate discomfort): Hand massage;Hand expression        Lactation Tools Discussed/Used     Consult Status Consult Status: Follow-up Date: 05/25/17 Follow-up type: In-patient    Charyl DancerCARVER, Lindsea Olivar G 05/24/2017, 6:48 AM

## 2017-05-25 MED ORDER — IBUPROFEN 600 MG PO TABS: 600.0000 mg | ORAL_TABLET | Freq: Four times a day (QID) | ORAL | 0 refills | Status: DC

## 2017-05-25 NOTE — Lactation Note (Signed)
This note was copied from a baby's chart. Lactation Consultation Note  Patient Name: Cheyenne Cleora FleetBonnie Bernet [id number]'WToday's Date: 05/25/2017 Reason for consult: Follow-up assessment  Baby is 2540 hours old and has been to the breast consistently.  Baby latched as LC walked in the room , with depth , swallows noted.  Mom denies soreness, and mentioned her nipples are normally sensitive.  LC instructed mom on the use of shells , comfort gels , hand pump.  Per mom the back stroke of the hand pump seems tight , LC provided a #27 for when the  Milk comes in. Sore nipple and engorgement prevention and tx reviewed.  @ consult LC changed a wet and has she was being changed wet again.  Also stool whole feeding on the right side, with depth and swallows, and released after 5 mins. Nipple well rounded.  Mother informed of post-discharge support and given phone number to the lactation department, including services for phone call assistance; out-patient appointments; and breastfeeding support group. List of other breastfeeding resources in the community given in the handout. Encouraged mother to call for problems or concerns related to breastfeeding. LC reviewed doc flow sheets/ WNL    Maternal Data    Feeding Feeding Type: Breast Fed Length of feed: 5 min (swallows noted , depth noted,stool/ released )  LATCH Score/Interventions Latch: Grasps breast easily, tongue down, lips flanged, rhythmical sucking. Intervention(s): Skin to skin Intervention(s): Adjust position;Assist with latch  Audible Swallowing: Spontaneous and intermittent  Type of Nipple: Everted at rest and after stimulation  Comfort (Breast/Nipple): Soft / non-tender     Hold (Positioning): Assistance needed to correctly position infant at breast and maintain latch.  LATCH Score: 9  Lactation Tools Discussed/Used Tools: Shells;Pump;Flanges;Comfort gels Flange Size: 27 (for when the milk cones in ) Shell Type: Inverted Breast pump type:  Manual WIC Program: No Pump Review: Setup, frequency, and cleaning Initiated by:: MAI  Date initiated:: 05/25/17   Consult Status Consult Status: Complete Date: 05/25/17 Follow-up type: In-patient    Cheyenne Alvarado 05/25/2017, 10:34 AM

## 2017-05-25 NOTE — Discharge Summary (Signed)
OB Discharge Summary     Patient Name: Cheyenne PicklesBonnie C Dettinger DOB: 01/26/1986 MRN: [id number]  Date of admission: 05/23/2017 Delivering MD: Jackelyn KnifeMEISINGER, Frank Novelo   Date of discharge: 05/25/2017  Admitting diagnosis: 40wks contractions 5 mins  Intrauterine pregnancy: 8760w0d     Secondary diagnosis:  Active Problems:   Indication for care in labor or delivery   SVD (spontaneous vaginal delivery)      Discharge diagnosis: [facility] P[facility]                                   Hospital course:  Onset of Labor With Vaginal Delivery     31 y.o. yo [id number] at 3060w0d was admitted in Latent Labor on 05/23/2017. Patient had an uncomplicated labor course as follows:  Membrane Rupture Time/Date: 12:33 PM ,05/23/2017   Intrapartum Procedures: Episiotomy: None [1]                                         Lacerations:  2nd degree [3];Vaginal [6]  Patient had a delivery of a Viable infant. 05/23/2017  Information for the patient's newborn:  Allison QuarryCobb, Girl Kendal HymenBonnie [[id number]][[id number]]  Delivery Method: VBAC    Pateint had an uncomplicated postpartum course.  She is ambulating, tolerating a regular diet, passing flatus, and urinating well. Patient is discharged home in stable condition on 05/25/17.   Physical exam  Vitals:   05/24/17 0540 05/24/17 0753 05/24/17 1753 05/25/17 0545  BP: (!) 133/97 (!) 90/44 124/66 (!) 111/52  Pulse: 75 65 74 61  Resp: 16 20 19 16   Temp: 97.6 F (36.4 C) 98.2 F (36.8 C) 98.9 F (37.2 C) 98.4 F (36.9 C)  TempSrc: Oral Oral Oral Oral  SpO2:      Weight:      Height:       General: alert Lochia: appropriate Uterine Fundus: firm  Labs: Lab Results  Component Value Date   WBC 18.7 (H) 05/24/2017   HGB 9.9 (L) 05/24/2017   HCT 28.8 (L) 05/24/2017   MCV 82.5 05/24/2017   PLT 243 05/24/2017   CMP Latest Ref Rng & Units 03/31/2015  Glucose 65 - 99 mg/dL 99  BUN 6 - 20 mg/dL 13  Creatinine 7.840.44 - 6.961.00 mg/dL 2.950.81  Sodium 284135 - 132145 mmol/L 137  Potassium 3.5 - 5.1  mmol/L 3.7  Chloride 101 - 111 mmol/L 103  CO2 22 - 32 mmol/L 24  Calcium 8.9 - 10.3 mg/dL 9.5  Total Protein 6.5 - 8.1 g/dL 8.1  Total Bilirubin 0.3 - 1.2 mg/dL 0.4  Alkaline Phos 38 - 126 U/L 71  AST 15 - 41 U/L 26  ALT 14 - 54 U/L 18    Discharge instruction: per After Visit Summary and "Baby and Me Booklet".  After visit meds:  Allergies as of 05/25/2017      Reactions   Erythromycin Nausea Only      Medication List    STOP taking these medications   ranitidine 150 MG tablet Commonly known as:  ZANTAC     TAKE these medications   ADDERALL PO Take 5-10 mg by mouth 2 (two) times daily.   ibuprofen 600 MG tablet Commonly known as:  ADVIL,MOTRIN Take 1 tablet (600 mg total) by mouth every 6 (six) hours.   prenatal multivitamin Tabs tablet  Take 1 tablet by mouth daily at 12 noon.       Diet: routine diet  Activity: Advance as tolerated. Pelvic rest for 6 weeks.   Outpatient follow up:3 weeks   Newborn Data: Live born female  Birth Weight: 7 lb 7.6 oz (3390 g) APGAR: 9, 9  Baby Feeding: Breast Disposition:home with mother   05/25/2017 Zenaida Niece, MD

## 2017-05-25 NOTE — Progress Notes (Signed)
Patient ID: Marvia PicklesBonnie C Greenstein, female   DOB: 04/10/1986, 31 y.o.   MRN: [id number] PPD #2 Doing well, sore Afeb, VSS D/c home

## 2017-05-25 NOTE — Discharge Instructions (Signed)
As per discharge pamphlet °

## 2017-05-25 NOTE — Progress Notes (Signed)
CSW acknowledges consult for MOB's hx of depression. This writer met with MOB at bedside to assess hx of depression. MOB informed this writer that her hx is long standing since childhood. This writer provided MOB with PPD/PMAD resources. This writer informed MOB to seek medical/professional guidance if she feels symptoms of PPD/PMAD on setting. MOB verbalized understanding and had no further questions. MOB was notably happy bonding with baby upon this writers arrival. FOB was also being supportive.   Their are no barriers to d/c.   Mozell Haber, MSW, LCSW-A Clinical Social Worker  [facility]  Office: [phone]  

## 2017-05-29 ENCOUNTER — Inpatient Hospital Stay (HOSPITAL_COMMUNITY): Admission: RE | Admit: 2017-05-29 | Payer: BLUE CROSS/BLUE SHIELD | Source: Ambulatory Visit

## 2017-08-08 DIAGNOSIS — F900 Attention-deficit hyperactivity disorder, predominantly inattentive type: Secondary | ICD-10-CM | POA: Diagnosis not present

## 2017-09-11 DIAGNOSIS — N926 Irregular menstruation, unspecified: Secondary | ICD-10-CM | POA: Diagnosis not present

## 2018-02-12 DIAGNOSIS — F900 Attention-deficit hyperactivity disorder, predominantly inattentive type: Secondary | ICD-10-CM | POA: Diagnosis not present

## 2018-05-19 DIAGNOSIS — B351 Tinea unguium: Secondary | ICD-10-CM | POA: Diagnosis not present

## 2018-07-13 IMAGING — US US MFM FETAL BPP W/O NON-STRESS
1 series · 7 of 7 positions shown · non-contrast
Comparison: none

[Series 1: us mfm fetal bpp w/o non-stress · 7 acquisitions, 7 frames shown]
[im 1/7]
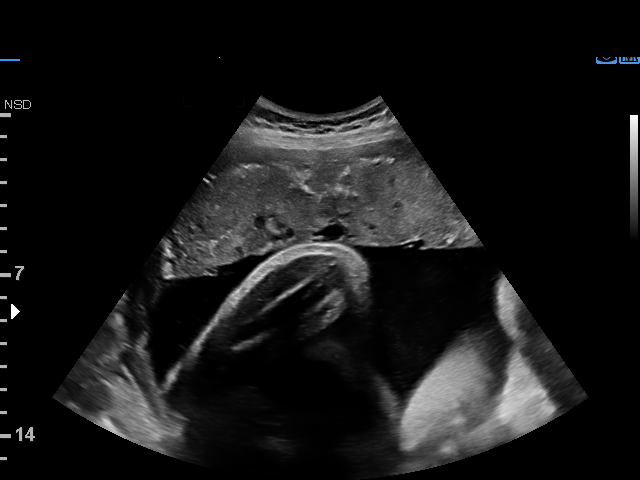
[im 2/7]
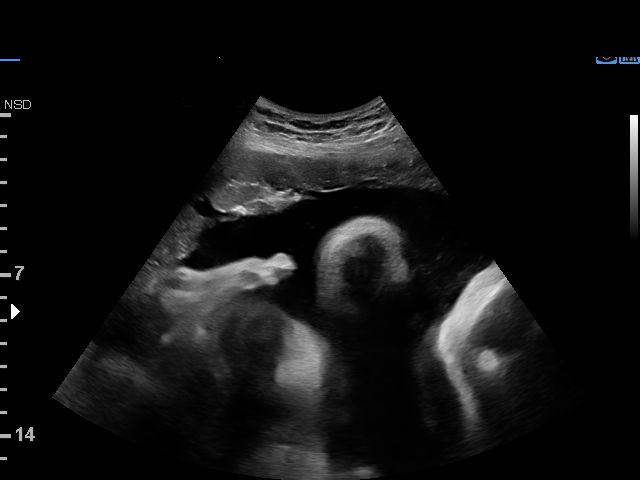
[im 3/7]
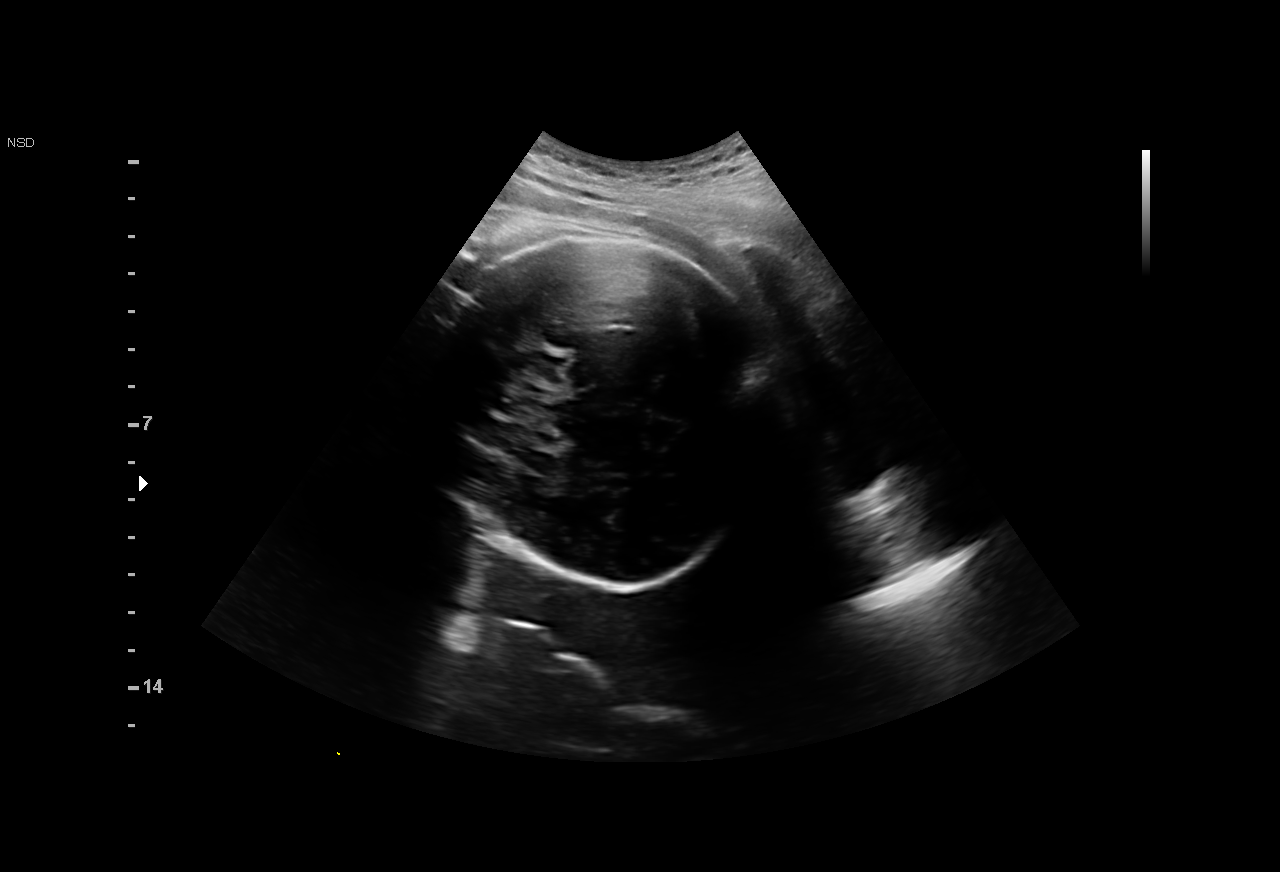
[im 4/7]
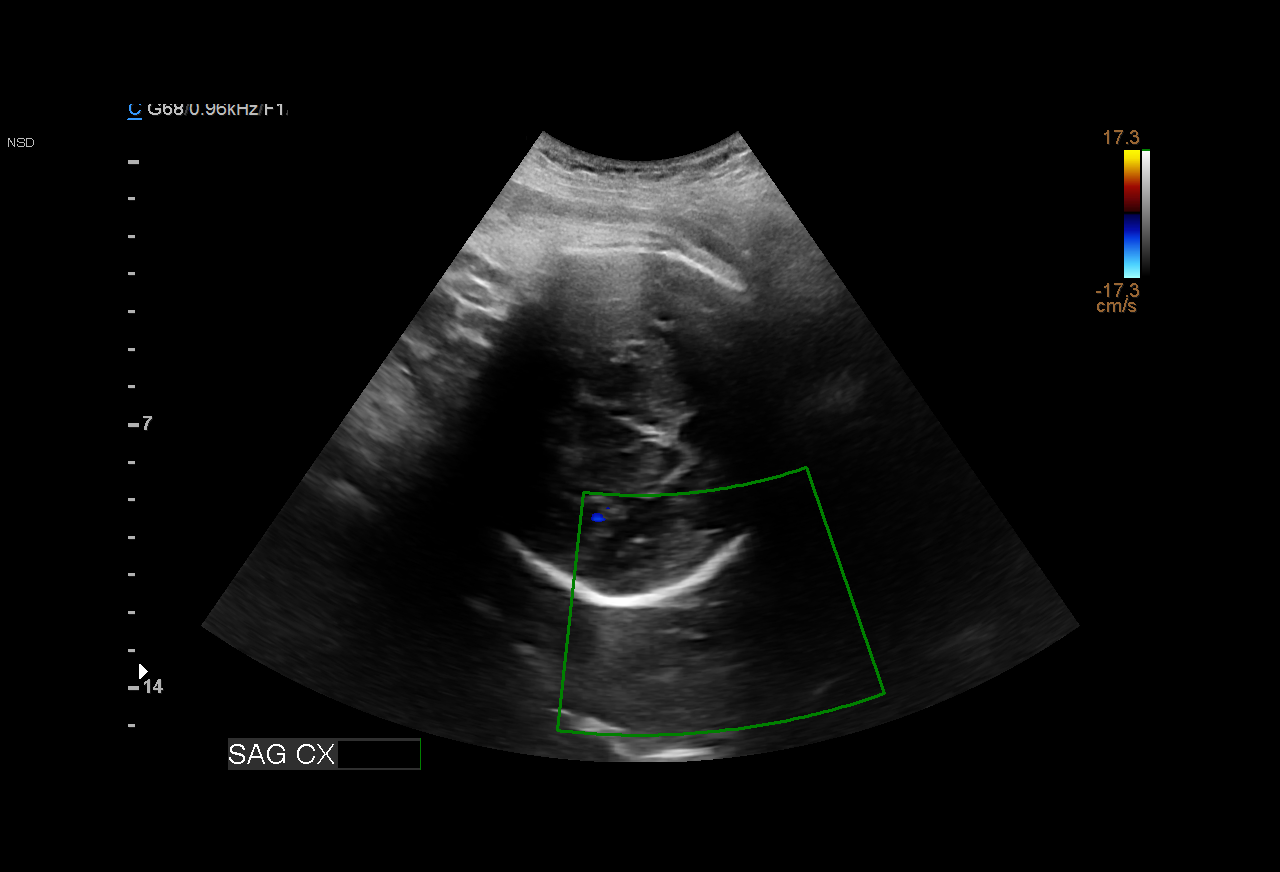
[im 5/7]
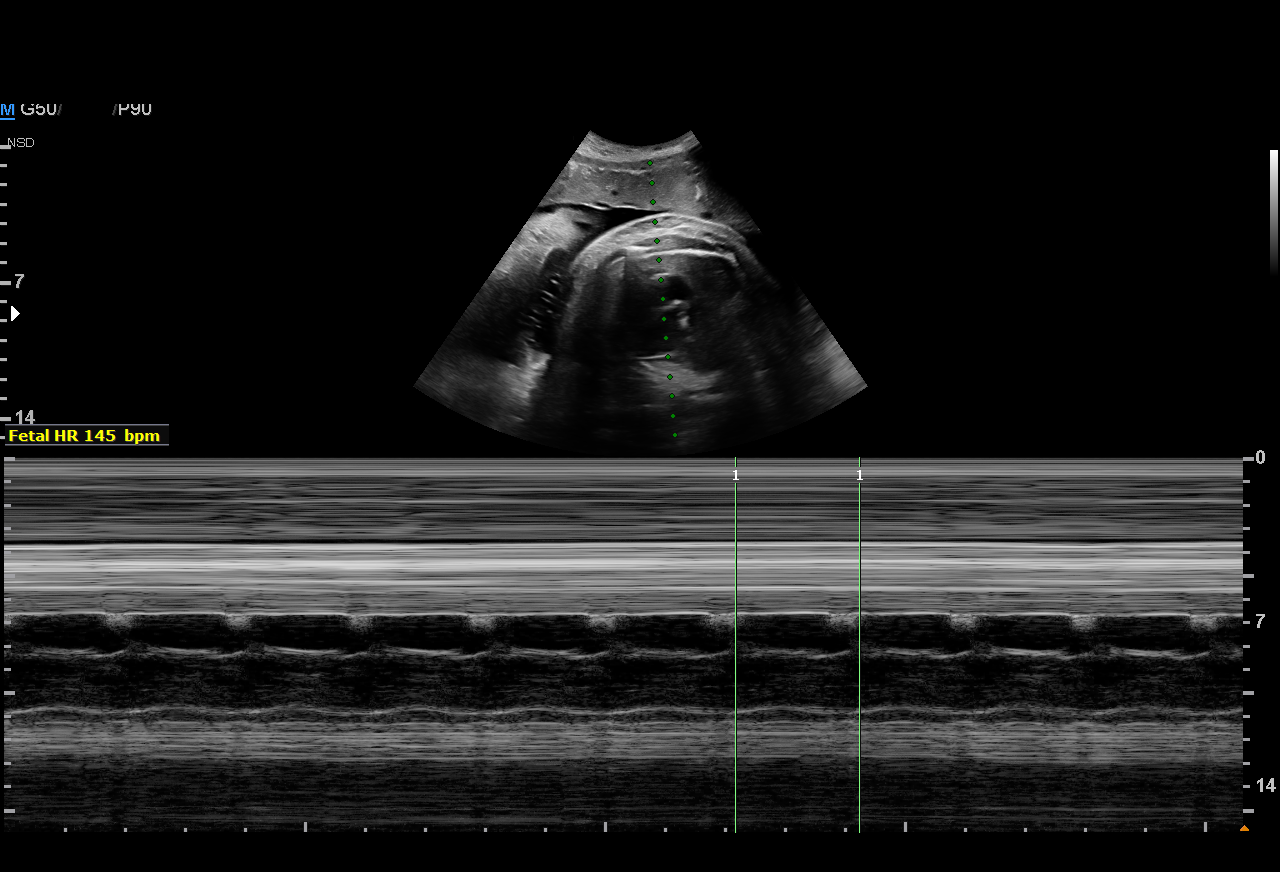
[im 6/7]
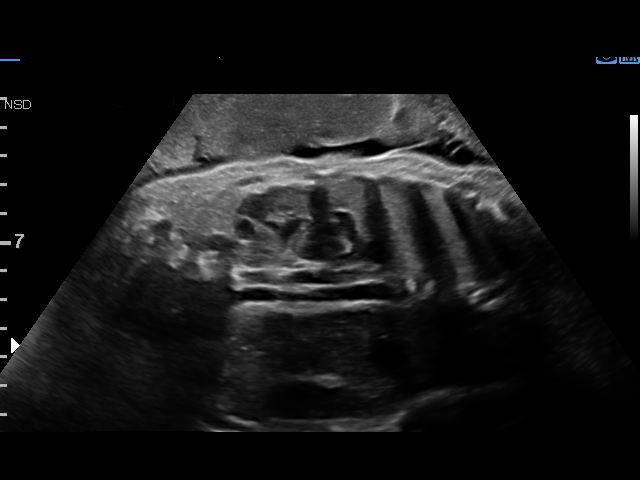
[im 7/7]
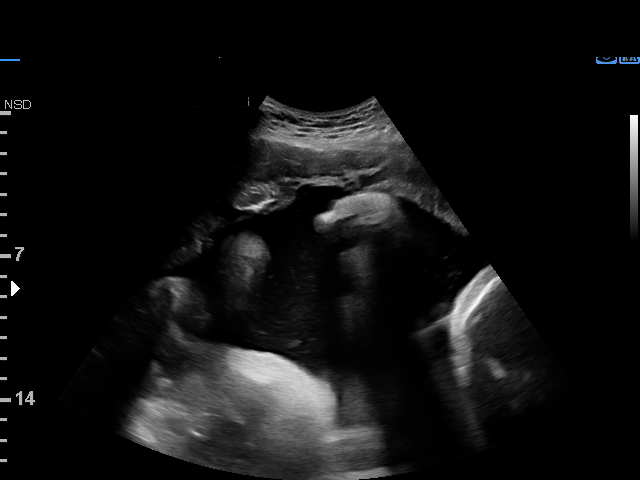

[7 of 7 positions shown; findings below may reference images not displayed]

[HOSPITAL],
[facility]
MAU/Triage

1  ARMOND KAMATH           [id number]      [id number]     [id number]
Indications

40 weeks gestation of pregnancy
Non-reactive NST
Postdate pregnancy (40-42 weeks)
OB History

Gravidity:    2         Term:   1        Prem:   0        SAB:   0
TOP:          0       Ectopic:  0        Living: 1
Fetal Evaluation

Num Of Fetuses:     1
Fetal Heart         145
Rate(bpm):
Cardiac Activity:   Observed
Presentation:       Cephalic

Amniotic Fluid
AFI FV:      Subjectively upper-normal

AFI Sum(cm)     %Tile       Largest Pocket(cm)
24.54           > 97

RUQ(cm)       RLQ(cm)       LUQ(cm)        LLQ(cm)
5.34
Biophysical Evaluation
Amniotic F.V:   Pocket => 2 cm two         F. Tone:        Observed
planes
F. Movement:    Observed                   Score:          [DATE]
F. Breathing:   Observed
Gestational Age

LMP:           40w 0d       Date:   08/16/16                 EDD:   05/23/17
Best:          40w 0d    Det. By:   LMP  (08/16/16)          EDD:   05/23/17
Impression

SIUP at 40+0 weeks
Cephalic presentation
High normal amniotic fluid volume
BPP [DATE]
Recommendations

Follow-up as clinically indicated

## 2018-08-14 DIAGNOSIS — F900 Attention-deficit hyperactivity disorder, predominantly inattentive type: Secondary | ICD-10-CM | POA: Diagnosis not present

## 2018-09-03 DIAGNOSIS — H903 Sensorineural hearing loss, bilateral: Secondary | ICD-10-CM | POA: Diagnosis not present

## 2019-01-22 DIAGNOSIS — F900 Attention-deficit hyperactivity disorder, predominantly inattentive type: Secondary | ICD-10-CM | POA: Diagnosis not present

## 2019-07-29 DIAGNOSIS — F988 Other specified behavioral and emotional disorders with onset usually occurring in childhood and adolescence: Secondary | ICD-10-CM | POA: Diagnosis not present

## 2019-10-06 DIAGNOSIS — Z20828 Contact with and (suspected) exposure to other viral communicable diseases: Secondary | ICD-10-CM | POA: Diagnosis not present

## 2020-03-17 DIAGNOSIS — F900 Attention-deficit hyperactivity disorder, predominantly inattentive type: Secondary | ICD-10-CM | POA: Diagnosis not present

## 2020-08-07 DIAGNOSIS — F411 Generalized anxiety disorder: Secondary | ICD-10-CM | POA: Diagnosis not present

## 2020-08-10 DIAGNOSIS — F411 Generalized anxiety disorder: Secondary | ICD-10-CM | POA: Diagnosis not present

## 2020-08-14 DIAGNOSIS — F411 Generalized anxiety disorder: Secondary | ICD-10-CM | POA: Diagnosis not present

## 2020-08-17 DIAGNOSIS — F411 Generalized anxiety disorder: Secondary | ICD-10-CM | POA: Diagnosis not present

## 2020-08-21 DIAGNOSIS — F411 Generalized anxiety disorder: Secondary | ICD-10-CM | POA: Diagnosis not present

## 2020-08-24 DIAGNOSIS — F411 Generalized anxiety disorder: Secondary | ICD-10-CM | POA: Diagnosis not present

## 2020-08-28 DIAGNOSIS — F411 Generalized anxiety disorder: Secondary | ICD-10-CM | POA: Diagnosis not present

## 2020-08-31 DIAGNOSIS — F411 Generalized anxiety disorder: Secondary | ICD-10-CM | POA: Diagnosis not present

## 2020-09-04 DIAGNOSIS — F411 Generalized anxiety disorder: Secondary | ICD-10-CM | POA: Diagnosis not present

## 2020-09-11 DIAGNOSIS — F411 Generalized anxiety disorder: Secondary | ICD-10-CM | POA: Diagnosis not present

## 2020-09-18 DIAGNOSIS — F411 Generalized anxiety disorder: Secondary | ICD-10-CM | POA: Diagnosis not present

## 2020-09-25 DIAGNOSIS — F411 Generalized anxiety disorder: Secondary | ICD-10-CM | POA: Diagnosis not present

## 2020-10-02 DIAGNOSIS — F411 Generalized anxiety disorder: Secondary | ICD-10-CM | POA: Diagnosis not present

## 2020-10-09 DIAGNOSIS — F411 Generalized anxiety disorder: Secondary | ICD-10-CM | POA: Diagnosis not present

## 2020-10-17 DIAGNOSIS — F411 Generalized anxiety disorder: Secondary | ICD-10-CM | POA: Diagnosis not present

## 2020-10-23 DIAGNOSIS — F411 Generalized anxiety disorder: Secondary | ICD-10-CM | POA: Diagnosis not present

## 2020-10-26 DIAGNOSIS — H9202 Otalgia, left ear: Secondary | ICD-10-CM | POA: Diagnosis not present

## 2020-10-31 DIAGNOSIS — F411 Generalized anxiety disorder: Secondary | ICD-10-CM | POA: Diagnosis not present

## 2020-11-01 DIAGNOSIS — H6523 Chronic serous otitis media, bilateral: Secondary | ICD-10-CM | POA: Diagnosis not present

## 2020-11-01 DIAGNOSIS — Z974 Presence of external hearing-aid: Secondary | ICD-10-CM | POA: Diagnosis not present

## 2020-11-01 DIAGNOSIS — H6593 Unspecified nonsuppurative otitis media, bilateral: Secondary | ICD-10-CM | POA: Insufficient documentation

## 2020-11-06 DIAGNOSIS — F411 Generalized anxiety disorder: Secondary | ICD-10-CM | POA: Diagnosis not present

## 2020-11-08 DIAGNOSIS — H7202 Central perforation of tympanic membrane, left ear: Secondary | ICD-10-CM | POA: Diagnosis not present

## 2020-11-08 DIAGNOSIS — H6593 Unspecified nonsuppurative otitis media, bilateral: Secondary | ICD-10-CM | POA: Diagnosis not present

## 2020-11-13 DIAGNOSIS — F411 Generalized anxiety disorder: Secondary | ICD-10-CM | POA: Diagnosis not present

## 2020-11-23 DIAGNOSIS — F411 Generalized anxiety disorder: Secondary | ICD-10-CM | POA: Diagnosis not present

## 2020-11-27 DIAGNOSIS — H6593 Unspecified nonsuppurative otitis media, bilateral: Secondary | ICD-10-CM | POA: Diagnosis not present

## 2020-11-27 DIAGNOSIS — F411 Generalized anxiety disorder: Secondary | ICD-10-CM | POA: Diagnosis not present

## 2020-11-27 DIAGNOSIS — H7202 Central perforation of tympanic membrane, left ear: Secondary | ICD-10-CM | POA: Diagnosis not present

## 2020-12-04 DIAGNOSIS — F411 Generalized anxiety disorder: Secondary | ICD-10-CM | POA: Diagnosis not present

## 2020-12-04 DIAGNOSIS — H9212 Otorrhea, left ear: Secondary | ICD-10-CM | POA: Insufficient documentation

## 2020-12-04 DIAGNOSIS — H7202 Central perforation of tympanic membrane, left ear: Secondary | ICD-10-CM | POA: Diagnosis not present

## 2020-12-05 DIAGNOSIS — H7202 Central perforation of tympanic membrane, left ear: Secondary | ICD-10-CM | POA: Diagnosis not present

## 2020-12-05 DIAGNOSIS — H9212 Otorrhea, left ear: Secondary | ICD-10-CM | POA: Diagnosis not present

## 2020-12-11 DIAGNOSIS — F411 Generalized anxiety disorder: Secondary | ICD-10-CM | POA: Diagnosis not present

## 2020-12-18 DIAGNOSIS — F411 Generalized anxiety disorder: Secondary | ICD-10-CM | POA: Diagnosis not present

## 2020-12-19 DIAGNOSIS — H7202 Central perforation of tympanic membrane, left ear: Secondary | ICD-10-CM | POA: Diagnosis not present

## 2020-12-19 DIAGNOSIS — H9212 Otorrhea, left ear: Secondary | ICD-10-CM | POA: Diagnosis not present

## 2020-12-25 DIAGNOSIS — F411 Generalized anxiety disorder: Secondary | ICD-10-CM | POA: Diagnosis not present

## 2021-01-01 DIAGNOSIS — F411 Generalized anxiety disorder: Secondary | ICD-10-CM | POA: Diagnosis not present

## 2021-01-08 DIAGNOSIS — F411 Generalized anxiety disorder: Secondary | ICD-10-CM | POA: Diagnosis not present

## 2021-01-15 DIAGNOSIS — F411 Generalized anxiety disorder: Secondary | ICD-10-CM | POA: Diagnosis not present

## 2021-01-22 DIAGNOSIS — F411 Generalized anxiety disorder: Secondary | ICD-10-CM | POA: Diagnosis not present

## 2021-01-29 DIAGNOSIS — F411 Generalized anxiety disorder: Secondary | ICD-10-CM | POA: Diagnosis not present

## 2021-02-05 DIAGNOSIS — F411 Generalized anxiety disorder: Secondary | ICD-10-CM | POA: Diagnosis not present

## 2021-02-12 DIAGNOSIS — F411 Generalized anxiety disorder: Secondary | ICD-10-CM | POA: Diagnosis not present

## 2021-02-19 DIAGNOSIS — F411 Generalized anxiety disorder: Secondary | ICD-10-CM | POA: Diagnosis not present

## 2021-02-26 DIAGNOSIS — F411 Generalized anxiety disorder: Secondary | ICD-10-CM | POA: Diagnosis not present

## 2021-03-06 DIAGNOSIS — F411 Generalized anxiety disorder: Secondary | ICD-10-CM | POA: Diagnosis not present

## 2021-03-20 DIAGNOSIS — F411 Generalized anxiety disorder: Secondary | ICD-10-CM | POA: Diagnosis not present

## 2021-03-26 DIAGNOSIS — F411 Generalized anxiety disorder: Secondary | ICD-10-CM | POA: Diagnosis not present

## 2021-04-02 DIAGNOSIS — F411 Generalized anxiety disorder: Secondary | ICD-10-CM | POA: Diagnosis not present

## 2021-04-11 DIAGNOSIS — F411 Generalized anxiety disorder: Secondary | ICD-10-CM | POA: Diagnosis not present

## 2021-04-18 DIAGNOSIS — F411 Generalized anxiety disorder: Secondary | ICD-10-CM | POA: Diagnosis not present

## 2021-04-23 DIAGNOSIS — F411 Generalized anxiety disorder: Secondary | ICD-10-CM | POA: Diagnosis not present

## 2021-04-30 DIAGNOSIS — F411 Generalized anxiety disorder: Secondary | ICD-10-CM | POA: Diagnosis not present

## 2021-05-07 DIAGNOSIS — F411 Generalized anxiety disorder: Secondary | ICD-10-CM | POA: Diagnosis not present

## 2021-05-21 DIAGNOSIS — F411 Generalized anxiety disorder: Secondary | ICD-10-CM | POA: Diagnosis not present

## 2021-06-07 DIAGNOSIS — F411 Generalized anxiety disorder: Secondary | ICD-10-CM | POA: Diagnosis not present

## 2021-06-11 DIAGNOSIS — F411 Generalized anxiety disorder: Secondary | ICD-10-CM | POA: Diagnosis not present

## 2021-06-18 DIAGNOSIS — F411 Generalized anxiety disorder: Secondary | ICD-10-CM | POA: Diagnosis not present

## 2021-06-25 DIAGNOSIS — F411 Generalized anxiety disorder: Secondary | ICD-10-CM | POA: Diagnosis not present

## 2021-07-17 DIAGNOSIS — F411 Generalized anxiety disorder: Secondary | ICD-10-CM | POA: Diagnosis not present

## 2021-08-14 DIAGNOSIS — F411 Generalized anxiety disorder: Secondary | ICD-10-CM | POA: Diagnosis not present

## 2021-10-11 DIAGNOSIS — F411 Generalized anxiety disorder: Secondary | ICD-10-CM | POA: Diagnosis not present

## 2021-11-02 DIAGNOSIS — H906 Mixed conductive and sensorineural hearing loss, bilateral: Secondary | ICD-10-CM | POA: Insufficient documentation

## 2021-11-02 DIAGNOSIS — H6993 Unspecified Eustachian tube disorder, bilateral: Secondary | ICD-10-CM | POA: Insufficient documentation

## 2021-11-28 DIAGNOSIS — H90A21 Sensorineural hearing loss, unilateral, right ear, with restricted hearing on the contralateral side: Secondary | ICD-10-CM | POA: Diagnosis not present

## 2021-12-12 DIAGNOSIS — H6983 Other specified disorders of Eustachian tube, bilateral: Secondary | ICD-10-CM | POA: Diagnosis not present

## 2021-12-12 DIAGNOSIS — H6502 Acute serous otitis media, left ear: Secondary | ICD-10-CM | POA: Diagnosis not present

## 2021-12-12 DIAGNOSIS — H903 Sensorineural hearing loss, bilateral: Secondary | ICD-10-CM | POA: Diagnosis not present

## 2022-01-07 DIAGNOSIS — H6983 Other specified disorders of Eustachian tube, bilateral: Secondary | ICD-10-CM | POA: Diagnosis not present

## 2022-01-07 DIAGNOSIS — H906 Mixed conductive and sensorineural hearing loss, bilateral: Secondary | ICD-10-CM | POA: Diagnosis not present

## 2022-03-05 DIAGNOSIS — H906 Mixed conductive and sensorineural hearing loss, bilateral: Secondary | ICD-10-CM | POA: Diagnosis not present

## 2022-03-05 DIAGNOSIS — H6983 Other specified disorders of Eustachian tube, bilateral: Secondary | ICD-10-CM | POA: Diagnosis not present

## 2022-06-18 DIAGNOSIS — J019 Acute sinusitis, unspecified: Secondary | ICD-10-CM | POA: Diagnosis not present

## 2022-08-10 DIAGNOSIS — Z202 Contact with and (suspected) exposure to infections with a predominantly sexual mode of transmission: Secondary | ICD-10-CM | POA: Diagnosis not present

## 2022-08-10 DIAGNOSIS — Z0189 Encounter for other specified special examinations: Secondary | ICD-10-CM | POA: Diagnosis not present

## 2022-08-10 DIAGNOSIS — Z708 Other sex counseling: Secondary | ICD-10-CM | POA: Diagnosis not present

## 2022-08-10 DIAGNOSIS — Z6833 Body mass index (BMI) 33.0-33.9, adult: Secondary | ICD-10-CM | POA: Diagnosis not present

## 2023-12-26 ENCOUNTER — Encounter (HOSPITAL_COMMUNITY): Payer: Self-pay | Admitting: *Deleted

## 2023-12-26 ENCOUNTER — Emergency Department (HOSPITAL_COMMUNITY): Payer: Medicaid Other

## 2023-12-26 ENCOUNTER — Emergency Department (HOSPITAL_COMMUNITY)
Admission: EM | Admit: 2023-12-26 | Discharge: 2023-12-26 | Disposition: A | Discharge status: Home / Self Care | Payer: Medicaid Other | Attending: Emergency Medicine | Admitting: Emergency Medicine

## 2023-12-26 ENCOUNTER — Other Ambulatory Visit: Payer: Self-pay

## 2023-12-26 DIAGNOSIS — S52131A Displaced fracture of neck of right radius, initial encounter for closed fracture: Secondary | ICD-10-CM | POA: Diagnosis not present

## 2023-12-26 DIAGNOSIS — W1839XA Other fall on same level, initial encounter: Secondary | ICD-10-CM | POA: Insufficient documentation

## 2023-12-26 DIAGNOSIS — M25521 Pain in right elbow: Secondary | ICD-10-CM | POA: Diagnosis present

## 2023-12-26 DIAGNOSIS — Y9339 Activity, other involving climbing, rappelling and jumping off: Secondary | ICD-10-CM | POA: Diagnosis not present

## 2023-12-26 DIAGNOSIS — Y9239 Other specified sports and athletic area as the place of occurrence of the external cause: Secondary | ICD-10-CM | POA: Diagnosis not present

## 2023-12-26 HISTORY — DX: Unspecified sensorineural hearing loss: H90.5

## 2023-12-26 MED ORDER — ONDANSETRON 4 MG PO TBDP: 4.0000 mg | ORAL_TABLET | Freq: Three times a day (TID) | ORAL | 0 refills | Status: DC | PRN

## 2023-12-26 MED ORDER — HYDROMORPHONE HCL 1 MG/ML IJ SOLN
1.0000 mg | Freq: Once | INTRAMUSCULAR | Status: AC
  Administered 2023-12-26: 1 mg via INTRAMUSCULAR
  Filled 2023-12-26: qty 1

## 2023-12-26 MED ORDER — ONDANSETRON 8 MG PO TBDP
8.0000 mg | ORAL_TABLET | Freq: Once | ORAL | Status: AC
  Administered 2023-12-26: 8 mg via ORAL
  Filled 2023-12-26: qty 1

## 2023-12-26 MED ORDER — OXYCODONE-ACETAMINOPHEN 5-325 MG PO TABS: 1.0000 | ORAL_TABLET | Freq: Four times a day (QID) | ORAL | 0 refills | Status: DC | PRN

## 2023-12-26 MED ORDER — PROMETHAZINE HCL 25 MG/ML IJ SOLN
25.0000 mg | Freq: Once | INTRAMUSCULAR | Status: AC
  Administered 2023-12-26: 25 mg via INTRAMUSCULAR
  Filled 2023-12-26: qty 1

## 2023-12-26 NOTE — ED Triage Notes (Signed)
 Pt c/o right arm injury this morning while at the gym. She reports she was doing a box jump and the box came out from under her and she fell to the ground. Pt c/o pain to elbow and upper arm.

## 2023-12-26 NOTE — ED Provider Notes (Signed)
 [place] EMERGENCY DEPARTMENT AT [facility] Provider Note   CSN: [id number] Arrival date & time: 12/26/23  0701     History  Chief Complaint  Patient presents with   Arm Injury    Cheyenne Alvarado is a 38 y.o. female.  She is here with complaint of severe right elbow pain after falling this morning.  She was at the gym trying to do a box jump and the box slipped out she fell backwards tripped breaking her fall with her right arm.  She denies any head or neck injury.  Complaining of severe pain right arm but primarily at her elbow.  Cannot move her arm due to pain.  No numbness.  The history is provided by the patient and the spouse.  Arm Injury Location:  Elbow Elbow location:  R elbow Injury: yes   Time since incident:  1 hour Mechanism of injury: fall   Fall:    Fall occurred:  Jumping from height   Impact surface:  Athletic surface Pain details:    Quality:  Throbbing   Radiates to:  R arm   Severity:  Severe   Onset quality:  Sudden   Timing:  Constant   Progression:  Unchanged Handedness:  Right-handed Associated symptoms: decreased range of motion   Associated symptoms: no back pain, no fever, no neck pain and no numbness        Home Medications Prior to Admission medications   Medication Sig Start Date End Date Taking? Authorizing Provider  Amphetamine-Dextroamphetamine (ADDERALL PO) Take 5-10 mg by mouth 2 (two) times daily.     [provider]  ibuprofen (ADVIL,MOTRIN) 600 MG tablet Take 1 tablet (600 mg total) by mouth every 6 (six) hours. 05/25/17   Meisinger, Tawanna Cooler, MD  Prenatal Vit-Fe Fumarate-FA (PRENATAL MULTIVITAMIN) TABS tablet Take 1 tablet by mouth daily at 12 noon.    [provider]      Allergies    Erythromycin    Review of Systems   Review of Systems  Constitutional:  Negative for fever.  Musculoskeletal:  Negative for back pain and neck pain.    Physical Exam Updated Vital Signs Ht 5\' 8"  (1.727 m)   Wt  110.7 kg   LMP 12/18/2023   BMI 37.10 kg/m  Physical Exam Vitals and nursing note reviewed.  Constitutional:      General: She is not in acute distress.    Appearance: Normal appearance. She is well-developed.  HENT:     Head: Normocephalic and atraumatic.  Cardiovascular:     Rate and Rhythm: Normal rate and regular rhythm.     Heart sounds: No murmur heard. Pulmonary:     Effort: Pulmonary effort is normal. No respiratory distress.     Breath sounds: Normal breath sounds.  Abdominal:     Palpations: Abdomen is soft.     Tenderness: There is no abdominal tenderness. There is no guarding or rebound.  Musculoskeletal:        General: Tenderness present.     Cervical back: Neck supple. No tenderness.     Comments: Right arm shoulder and wrist nontender.  Right elbow severe tenderness.  No gross deformities.  Radial pulse 2+.  Able to move digits but makes elbow hurt.  Cap refill brisk.  Skin:    General: Skin is warm and dry.     Capillary Refill: Capillary refill takes less than 2 seconds.  Neurological:     Mental Status: She is alert.  Sensory: No sensory deficit.     Motor: No weakness.     ED Results / Procedures / Treatments   Labs (all labs ordered are listed, but only abnormal results are displayed) Labs Reviewed - No data to display  EKG None  Radiology DG Elbow Complete Right Result Date: 12/26/2023 CLINICAL DATA:  38 year old female status post fall, blunt trauma. Pain. EXAM: RIGHT ELBOW - COMPLETE 3+ VIEW COMPARISON:  None Available. FINDINGS: Bone mineralization is within normal limits. No joint effusion is evident. However, there is suspicious curvilinear lucency at the junction of the radial head and neck, and evidence of posterior, radial cortical fracture with tiny fragment. The head also appears slightly impacted distally. The distal humerus and proximal ulna appear intact. No discrete soft tissue injury identified. IMPRESSION: Mildly impacted fracture  of the neck of the proximal Radius. No dislocation. No joint effusion is evident. Electronically Signed   By: Odessa Fleming M.D.   On: 12/26/2023 08:06    Procedures Procedures    Medications Ordered in ED Medications  HYDROmorphone (DILAUDID) injection 1 mg (1 mg Intramuscular Given 12/26/23 0735)  ondansetron (ZOFRAN-ODT) disintegrating tablet 8 mg (8 mg Oral Given 12/26/23 0757)  HYDROmorphone (DILAUDID) injection 1 mg (1 mg Intramuscular Given 12/26/23 0856)  promethazine (PHENERGAN) injection 25 mg (25 mg Intramuscular Given 12/26/23 1018)    ED Course/ Medical Decision Making/ A&P Clinical Course as of 12/26/23 1816  Fri Dec 26, 2023  0739 X-ray appears to show radial head fracture.  Awaiting final reading. [MB]    Clinical Course User Index [MB] Terrilee Files, MD                                 Medical Decision Making Amount and/or Complexity of Data Reviewed Radiology: ordered.  Risk Prescription drug management.  This patient complains of right elbow injury; this involves an extensive number of treatment Options and is a complaint that carries with it a high risk of complications and morbidity. The differential includes fracture, contusion, dislocation  I ordered medication IM pain medication nausea medication and reviewed PMP when indicated. I ordered imaging studies which included right elbow x-ray and I independently    visualized and interpreted imaging which showed radial neck fracture Additional history obtained from patient's significant other Previous records obtained and reviewed in epic no recent admissions Social determinants considered, patient with food and utility insecurity Critical Interventions: None  After the interventions stated above, I reevaluated the patient and found patient to be neurovascularly intact and pain better Admission and further testing considered, she would need close outpatient follow-up with orthopedics.  Given prescription for  pain and nausea medication.  Return instructions discussed         Final Clinical Impression(s) / ED Diagnoses Final diagnoses:  Closed displaced fracture of neck of right radius, initial encounter    Rx / DC Orders ED Discharge Orders          Ordered    oxyCODONE-acetaminophen (PERCOCET/ROXICET) 5-325 MG tablet  Every 6 hours PRN        12/26/23 0832    ondansetron (ZOFRAN-ODT) 4 MG disintegrating tablet  Every 8 hours PRN        12/26/23 0914              Terrilee Files, MD 12/26/23 [phone]

## 2023-12-30 ENCOUNTER — Encounter: Payer: Self-pay | Admitting: Orthopedic Surgery

## 2023-12-30 ENCOUNTER — Ambulatory Visit: Payer: Medicaid Other | Admitting: Orthopedic Surgery

## 2023-12-30 VITALS — BP 118/76 | HR 87 | Ht 68.0 in | Wt 247.0 lb

## 2023-12-30 DIAGNOSIS — S52134A Nondisplaced fracture of neck of right radius, initial encounter for closed fracture: Secondary | ICD-10-CM

## 2023-12-30 MED ORDER — OXYCODONE HCL 5 MG PO TABS
5.0000 mg | ORAL_TABLET | Freq: Four times a day (QID) | ORAL | 0 refills | Status: AC | PRN
Start: 2023-12-30 — End: 2024-01-06

## 2023-12-30 NOTE — Progress Notes (Signed)
 New Patient Visit  Assessment: Cheyenne Alvarado is a 38 y.o. female with the following: 1. Closed nondisplaced fracture of neck of right radius, initial encounter  Plan: Cheyenne Alvarado is a minimally displaced but impacted right radial neck fracture.  She has swelling about the elbow, pain with motion.  She also is complaining of some pain and tenderness over the medial elbow.  We discussed the nature of her injury.  Typically with these types of injuries we will immobilize for short period of time, and allow for some gentle range of motion.  She is opted for a hinged elbow brace, which can be unlocked for gentle range of motion.  This will continue to provide stability for her elbow.  I think this is reasonable.  I provided a limited prescription of oxycodone.  I would like see her back in 2 weeks for repeat evaluation, including new x-rays.  Follow-up: Return in about 2 weeks (around 01/13/2024).  Subjective:  Chief Complaint  Patient presents with   Fracture    R elbow DOI 12/26/23    History of Present Illness: Cheyenne Alvarado is a 38 y.o. female who presents for evaluation of right elbow pain.  She is right-hand dominant.  She was working out, doing some box jumps.  The box shifted underneath..  She landed on an outstretched right hand, as she fell.  She had immediate pain.  She went to the emergency department, where radiographs demonstrated a fracture of the right radial neck, which included some impaction.  She continues to have pain.  She has been in a posterior slab splint.  She is also been using a sling.  Pain medicines as needed.  She denies numbness and tingling.   Review of Systems: No fevers or chills No numbness or tingling No chest pain No shortness of breath No bowel or bladder dysfunction No GI distress No headaches +Nausea   Medical History:  Past Medical History:  Diagnosis Date   ADHD (attention deficit hyperactivity disorder)    Congenital hearing disorder of  both ears    Depression     Past Surgical History:  Procedure Laterality Date   CESAREAN SECTION     KIDNEY SURGERY      No family history on file. Social History   Tobacco Use   Smoking status: Former    Current packs/day: 0.50    Average packs/day: 0.5 packs/day for 15.0 years (7.5 ttl pk-yrs)    Types: Cigarettes   Smokeless tobacco: Never  Vaping Use   Vaping status: Never Used  Substance Use Topics   Alcohol use: Yes    Alcohol/week: 7.0 standard drinks of alcohol    Types: 7 Cans of beer per week    Comment: occasionally   Drug use: No    Allergies  Allergen Reactions   Erythromycin Nausea Only    No outpatient medications have been marked as taking for the 12/30/23 encounter (Office Visit) with Oliver Barre, MD.    Objective: BP 118/76   Pulse 87   Ht 5\' 8"  (1.727 m)   Wt 247 lb (112 kg)   LMP 12/18/2023   BMI 37.56 kg/m   Physical Exam:  General: Alert and oriented. and No acute distress. Gait: Normal gait.  Alert and oriented.  No acute distress.  Upon removal of the splint, she did become lightheaded, and nauseous.  Diffuse swelling about the elbow.  No bruising.  No redness.  Tenderness palpation of the lateral, as well  as medial.  She has a short arc of motion that she tolerates.  Fingers are warm and well-perfused.  Sensation intact throughout the right hand.  IMAGING: I personally reviewed images previously obtained from the ED  X-rays of the right elbow were obtained in the ED. no dislocation.  Impacted, minimally displaced fracture of the radial neck.  Radial head appears to be intact.   New Medications:  No orders of the defined types were placed in this encounter.     Oliver Barre, MD  12/30/2023 11:38 AM

## 2023-12-30 NOTE — Patient Instructions (Signed)
 Brace on the elbow for support.  Ok to work on gentle range of motion.  Medications as needed  Follow up in 2 weeks for repeat XR

## 2024-01-01 ENCOUNTER — Telehealth: Payer: Medicaid Other | Admitting: Physician Assistant

## 2024-01-01 DIAGNOSIS — H9212 Otorrhea, left ear: Secondary | ICD-10-CM

## 2024-01-01 DIAGNOSIS — H9193 Unspecified hearing loss, bilateral: Secondary | ICD-10-CM

## 2024-01-01 MED ORDER — CLARITIN-D 12 HOUR 5-120 MG PO TB12: 1.0000 | ORAL_TABLET | Freq: Two times a day (BID) | ORAL | 0 refills | Status: DC

## 2024-01-01 MED ORDER — FLUTICASONE PROPIONATE 50 MCG/ACT NA SUSP
2.0000 | Freq: Every day | NASAL | 0 refills | Status: DC
Start: 2024-01-01 — End: 2024-06-28

## 2024-01-01 NOTE — Progress Notes (Signed)
 Virtual Visit Consent   Cheyenne Alvarado, you are scheduled for a virtual visit with a [facility] provider today. Just as with appointments in the office, your consent must be obtained to participate. Your consent will be active for this visit and any virtual visit you may have with one of our providers in the next 365 days. If you have a MyChart account, a copy of this consent can be sent to you electronically.  As this is a virtual visit, video technology does not allow for your provider to perform a traditional examination. This may limit your provider's ability to fully assess your condition. If your provider identifies any concerns that need to be evaluated in person or the need to arrange testing (such as labs, EKG, etc.), we will make arrangements to do so. Although advances in technology are sophisticated, we cannot ensure that it will always work on either your end or our end. If the connection with a video visit is poor, the visit may have to be switched to a telephone visit. With either a video or telephone visit, we are not always able to ensure that we have a secure connection.  By engaging in this virtual visit, you consent to the provision of healthcare and authorize for your insurance to be billed (if applicable) for the services provided during this visit. Depending on your insurance coverage, you may receive a charge related to this service.  I need to obtain your verbal consent now. Are you willing to proceed with your visit today? TOSCA PLETZ has provided verbal consent on 01/01/2024 for a virtual visit (video or telephone). Cheyenne Alvarado, [place]  Date: 01/01/2024 2:47 PM   Virtual Visit via Video Note   I, Cheyenne Alvarado, connected with  LYLIE BLACKLOCK  ([id number], [birth year]) on 01/01/24 at  2:15 PM EST by a video-enabled telemedicine application and verified that I am speaking with the correct person using two identifiers.  Location: Patient: Virtual Visit Location  Patient: Home Provider: Virtual Visit Location Provider: Home Office   I discussed the limitations of evaluation and management by telemedicine and the availability of in person appointments. The patient expressed understanding and agreed to proceed.    History of Present Illness: Cheyenne Alvarado is a 38 y.o. who identifies as a female who was assigned female at birth, and is being seen today for request for referral to ENT provider as she is new to the area. Notes chronic hearing loss since young age with use of hearing aids. Notes ongoing issue and wants to be followed by a specialist. Currently noting some drainage from her left ear s/p a cold a week or so ago. Scant lingering congestion. Denies ear pain or fullness, just some purulent drainage from ear. Denies change in hearing as she has chronic hearing loss. Denies fever, chills, malaise or fatigue.   HPI: HPI  Problems:  Patient Active Problem List   Diagnosis Date Noted   Indication for care in labor or delivery 05/23/2017   SVD (spontaneous vaginal delivery) 05/23/2017    Allergies:  Allergies  Allergen Reactions   Erythromycin Nausea Only   Medications:  Current Outpatient Medications:    oxyCODONE (ROXICODONE) 5 MG immediate release tablet, Take 1 tablet (5 mg total) by mouth every 6 (six) hours as needed for up to 7 days., Disp: 20 tablet, Rfl: 0  Observations/Objective: Patient is well-developed, well-nourished in no acute distress.  Resting comfortably at home.  Head is normocephalic, atraumatic.  No labored breathing. Speech is clear and coherent with logical content.  Patient is alert and oriented at baseline.   Assessment and Plan: 1. Bilateral hearing loss, unspecified hearing loss type (Primary)  2. Ear drainage, left  Discussed we cannot refer via virtual urgent care but gave her list of resources for PCP who can place referral for her for ongoing observation/management of chronic hearing loss. Active drainage  raising concern for recent AOM with perforation. No fever, chills, pain to indicate continued infection itself. Residual nasal congestion for which will have her start Flonase and Claritin-D. In person evaluation discussed.   Follow Up Instructions: I discussed the assessment and treatment plan with the patient. The patient was provided an opportunity to ask questions and all were answered. The patient agreed with the plan and demonstrated an understanding of the instructions.  A copy of instructions were sent to the patient via MyChart unless otherwise noted below.   The patient was advised to call back or seek an in-person evaluation if the symptoms worsen or if the condition fails to improve as anticipated.    Cheyenne Climes, PA-C

## 2024-01-01 NOTE — Patient Instructions (Signed)
  Marvia Pickles, thank you for joining Piedad Climes, PA-C for today's virtual visit.  While this provider is not your primary care provider (PCP), if your PCP is located in our provider database this encounter information will be shared with them immediately following your visit.   A [facility] MyChart account gives you access to today's visit and all your visits, tests, and labs performed at [facility] " click here if you don't have a [facility] MyChart account or go to [email or web address]  Consent: (Patient) Marvia Pickles provided verbal consent for this virtual visit at the beginning of the encounter.  Current Medications:  Current Outpatient Medications:    oxyCODONE (ROXICODONE) 5 MG immediate release tablet, Take 1 tablet (5 mg total) by mouth every 6 (six) hours as needed for up to 7 days., Disp: 20 tablet, Rfl: 0   Medications ordered in this encounter:  No orders of the defined types were placed in this encounter.    *If you need refills on other medications prior to your next appointment, please contact your pharmacy*  Follow-Up: Call back or seek an in-person evaluation if the symptoms worsen or if the condition fails to improve as anticipated.  [facility] [phone]  Other Instructions Please take the Claritin-D and Flonase as directed. Use the link below to get established with a  new PCP who can get you set up with an ENT in our area.    If you have been instructed to have an in-person evaluation today at a local Urgent Care facility, please use the link below. It will take you to a list of all of our available [facility], including address, phone number and hours of operation. Please do not delay care.  [facility]  If you or a family member do not have a primary care provider, use the link below to schedule a visit and establish care. When you choose a [facility] primary care physician or advanced  practice provider, you gain a long-term partner in health. Find a Primary Care Provider  Learn more about [facility] in-office and virtual care options: [facility] Get Care Now

## 2024-01-13 ENCOUNTER — Other Ambulatory Visit (INDEPENDENT_AMBULATORY_CARE_PROVIDER_SITE_OTHER): Payer: Self-pay

## 2024-01-13 ENCOUNTER — Encounter: Payer: Self-pay | Admitting: Orthopedic Surgery

## 2024-01-13 ENCOUNTER — Ambulatory Visit (INDEPENDENT_AMBULATORY_CARE_PROVIDER_SITE_OTHER): Payer: Medicaid Other | Admitting: Orthopedic Surgery

## 2024-01-13 DIAGNOSIS — S52134A Nondisplaced fracture of neck of right radius, initial encounter for closed fracture: Secondary | ICD-10-CM

## 2024-01-13 DIAGNOSIS — S52134D Nondisplaced fracture of neck of right radius, subsequent encounter for closed fracture with routine healing: Secondary | ICD-10-CM

## 2024-01-13 MED ORDER — OXYCODONE HCL 5 MG PO TABS: 5.0000 mg | ORAL_TABLET | Freq: Four times a day (QID) | ORAL | 0 refills | Status: AC | PRN

## 2024-01-13 NOTE — Progress Notes (Signed)
 Return patient Visit  Assessment: Cheyenne Alvarado is a 38 y.o. female with the following: 1. Closed nondisplaced fracture of neck of right radius, subsequent encounter  Plan: Marvia Pickles is a minimally displaced but impacted right radial neck fracture.  Her pain is improved.  She has break of range of motion, with the exception of pronation and supination.  Urged her to continue to work on gentle range of motion of the elbow, and rely on the brace for the next 1-2 weeks.  I would like to see her back in 3 weeks for repeat evaluation.  No lifting anything greater than a coffee cup.   Follow-up: Return in about 3 weeks (around 02/03/2024).  Subjective:  Chief Complaint  Patient presents with   Fracture    R elbow DOI 12/26/23    History of Present Illness: Cheyenne Alvarado is a 38 y.o. female who returns for evaluation of right elbow pain.  She is right-hand dominant.  She sustained an injury to her right elbow 2-3 weeks ago.  She has been using a hinged elbow brace, most of the time since I last saw her.  Her pain is improved.  She does continue to take oxycodone occasionally.  She notes good range of motion of the right elbow, with some pain with pronation and supination.  The pain does radiate distally, especially into her right thumb.  Review of Systems: No fevers or chills No numbness or tingling No chest pain No shortness of breath No bowel or bladder dysfunction No GI distress No headaches No Nausea    Objective: LMP 12/18/2023   Physical Exam:  General: Alert and oriented. and No acute distress. Gait: Normal gait.  Alert and oriented.  No acute distress.  Minimal bruising or swelling about the elbow.  Tenderness palpation over the radial head.  She has near full extension, and tolerates flexion beyond 120 degrees.  She has some pain with pronation and supination.  Fingers are warm and well-perfused.  Sensation intact throughout the radial head.  IMAGING: I personally  ordered and reviewed the following images  X-rays of the right elbow were obtained in clinic today.  These are compared to prior x-rays.  Minimally displaced, but impacted fracture of the right radial neck.  Overall alignment remains unchanged.  No dislocation.  No subsidence.  No callus formation.  Impression: Stable right radial neck fracture   New Medications:  Meds ordered this encounter  Medications   oxyCODONE (ROXICODONE) 5 MG immediate release tablet    Sig: Take 1 tablet (5 mg total) by mouth every 6 (six) hours as needed for up to 7 days.    Dispense:  15 tablet    Refill:  0      Oliver Barre, MD  01/13/2024 2:28 PM

## 2024-02-03 ENCOUNTER — Other Ambulatory Visit (INDEPENDENT_AMBULATORY_CARE_PROVIDER_SITE_OTHER): Payer: Self-pay

## 2024-02-03 ENCOUNTER — Encounter: Payer: Self-pay | Admitting: Orthopedic Surgery

## 2024-02-03 ENCOUNTER — Ambulatory Visit (INDEPENDENT_AMBULATORY_CARE_PROVIDER_SITE_OTHER): Admitting: Orthopedic Surgery

## 2024-02-03 DIAGNOSIS — S52134D Nondisplaced fracture of neck of right radius, subsequent encounter for closed fracture with routine healing: Secondary | ICD-10-CM | POA: Diagnosis not present

## 2024-02-03 NOTE — Progress Notes (Signed)
 Return patient Visit  Assessment: Cheyenne Alvarado is a 38 y.o. female with the following: 1. Closed nondisplaced fracture of neck of right radius, subsequent encounter  Plan: Cheyenne Alvarado is a minimally displaced but impacted right radial neck fracture.  Injury was about 6 weeks ago.  She has near full range of motion.  She is lacking a little bit of supination.  Minimal tenderness to palpation.  Radiographs remained stable.  At this point, she can increase her level of activity.  I have recommended that she start slow.  Okay to return to volleyball activities.  She is aware that impact such as during a dive could cause issues.  Medications as needed, including Tylenol or ibuprofen.  If she has a further issues, she will follow-up in clinic.  Follow-up: Return if symptoms worsen or fail to improve.  Subjective:  Chief Complaint  Patient presents with   Fracture    R elbow DOI 12/26/23  Has some tension and popping at the elbow, as well as pain in the wrist still.     History of Present Illness: Cheyenne Alvarado is a 38 y.o. female who returns for evaluation of right elbow pain.  She is right-hand dominant.  She sustained an injury to her right elbow fractionally 6 weeks ago.  She is no longer using a brace.  Her pain is improved.  Her motion is improved.  She is anxious to return to activities, including volleyball.  She is not taking any medicines.  No numbness or tingling.   Review of Systems: No fevers or chills No numbness or tingling No chest pain No shortness of breath No bowel or bladder dysfunction No GI distress No headaches No Nausea    Objective: There were no vitals taken for this visit.  Physical Exam:  General: Alert and oriented. and No acute distress. Gait: Normal gait.  Alert and oriented.  No acute distress.  Mild swelling about the elbow was appreciated.  She has full extension, flexion to 120 degrees.  She has full pronation.  She is lacking a few degrees  of supination, with some discomfort.  Mild discomfort over the radial head laterally.  No bruising is appreciated.  Fingers warm and well-perfused.  She is good range of motion in the wrist.  Good grip strength.  IMAGING: I personally ordered and reviewed the following images  X-rays of the right elbow were obtained in clinic today.  No acute injuries are noted.  No dislocation.  Minimally displaced, impacted radial head fracture is a visualized.  No interval displacement.  No obvious callus formation.  No bony lesions.  Impression: Stable right radial head fracture   New Medications:  No orders of the defined types were placed in this encounter.     Oliver Barre, MD  02/03/2024 9:30 AM

## 2024-06-28 ENCOUNTER — Ambulatory Visit (INDEPENDENT_AMBULATORY_CARE_PROVIDER_SITE_OTHER)

## 2024-06-28 VITALS — BP 117/82 | HR 82 | Ht 67.0 in | Wt 245.0 lb

## 2024-06-28 DIAGNOSIS — Z6838 Body mass index (BMI) 38.0-38.9, adult: Secondary | ICD-10-CM | POA: Diagnosis not present

## 2024-06-28 DIAGNOSIS — M722 Plantar fascial fibromatosis: Secondary | ICD-10-CM

## 2024-06-28 DIAGNOSIS — E66812 Obesity, class 2: Secondary | ICD-10-CM | POA: Insufficient documentation

## 2024-06-28 MED ORDER — PHENTERMINE HCL 37.5 MG PO TABS: 18.2500 mg | ORAL_TABLET | Freq: Every day | ORAL | 2 refills | Status: DC

## 2024-06-28 NOTE — Progress Notes (Unsigned)
   Established Patient Office Visit  Subjective   Patient ID: Cheyenne Alvarado, female    DOB: 08/18/1986  Age: 38 y.o. MRN: [id number]  Chief Complaint  Patient presents with   Establish Care    Pt here to establish care, and curious about possible weight loss options     HPI  Patient Active Problem List   Diagnosis Date Noted   Indication for care in labor or delivery 05/23/2017   SVD (spontaneous vaginal delivery) 05/23/2017    ROS    Objective:     BP 117/82   Pulse 82   Ht 5' 7 (1.702 m)   Wt 245 lb (111.1 kg)   SpO2 97%   Breastfeeding No   BMI 38.37 kg/m  BP Readings from Last 3 Encounters:  06/28/24 117/82  12/30/23 118/76  12/26/23 100/61   Wt Readings from Last 3 Encounters:  06/28/24 245 lb (111.1 kg)  12/30/23 247 lb (112 kg)  12/26/23 244 lb (110.7 kg)      Physical Exam Vitals and nursing note reviewed.  Constitutional:      Appearance: Normal appearance. She is obese.  HENT:     Head: Normocephalic.  Eyes:     Extraocular Movements: Extraocular movements intact.     Pupils: Pupils are equal, round, and reactive to light.  Cardiovascular:     Rate and Rhythm: Normal rate and regular rhythm.  Pulmonary:     Effort: Pulmonary effort is normal.     Breath sounds: Normal breath sounds.  Musculoskeletal:     Cervical back: Normal range of motion and neck supple.  Neurological:     Mental Status: She is alert and oriented to person, place, and time.  Psychiatric:        Mood and Affect: Mood normal.        Thought Content: Thought content normal.     No results found for any visits on 06/28/24.  {Labs (Optional):23779}  The ASCVD Risk score (Arnett DK, et al., 2019) failed to calculate for the following reasons:   The 2019 ASCVD risk score is only valid for ages 1 to 27    Assessment & Plan:   Problem List Items Addressed This Visit   None   No follow-ups on file.    Leita Longs, FNP

## 2024-07-02 DIAGNOSIS — M722 Plantar fascial fibromatosis: Secondary | ICD-10-CM | POA: Insufficient documentation

## 2024-07-02 NOTE — Assessment & Plan Note (Signed)
 Obesity with recent weight gain, likely exacerbated by stress and lifestyle factors. Interested in weight loss and attempting lifestyle modifications. - Discussed phentermine  as an appetite suppressant, covered by insurance, as an alternative to [place]. - Explained phentermine 's short-term use (up to 3 months) for appetite suppression and energy. - Discussed Wegovy's mechanism and side effects, including constipation and potential gastroparesis. - Prescribed phentermine , starting with half a tablet to assess tolerance.  -PDMP reviewed.  - Encouraged continuation of diet and exercise.

## 2024-07-02 NOTE — Assessment & Plan Note (Signed)
 Chronic plantar fasciitis with increased pain, possibly due to weight gain and prolonged standing. - Refer to podiatrist for further evaluation and management.

## 2024-07-06 ENCOUNTER — Encounter: Payer: Self-pay | Admitting: Podiatrist

## 2024-07-06 ENCOUNTER — Ambulatory Visit (INDEPENDENT_AMBULATORY_CARE_PROVIDER_SITE_OTHER)

## 2024-07-06 ENCOUNTER — Ambulatory Visit (INDEPENDENT_AMBULATORY_CARE_PROVIDER_SITE_OTHER): Admitting: Podiatrist

## 2024-07-06 ENCOUNTER — Telehealth: Payer: Self-pay | Admitting: Podiatrist

## 2024-07-06 ENCOUNTER — Encounter: Payer: Self-pay | Admitting: Lab

## 2024-07-06 DIAGNOSIS — M7662 Achilles tendinitis, left leg: Secondary | ICD-10-CM | POA: Diagnosis not present

## 2024-07-06 DIAGNOSIS — M722 Plantar fascial fibromatosis: Secondary | ICD-10-CM | POA: Diagnosis not present

## 2024-07-06 DIAGNOSIS — M7661 Achilles tendinitis, right leg: Secondary | ICD-10-CM

## 2024-07-06 MED ORDER — MELOXICAM 15 MG PO TABS: 15.0000 mg | ORAL_TABLET | Freq: Every day | ORAL | 2 refills | Status: AC

## 2024-07-06 NOTE — Telephone Encounter (Signed)
 Patient called back and stated the only physical therapy office [facility] with her insurance in [place] is [facility] located at [employer].

## 2024-07-06 NOTE — Telephone Encounter (Signed)
 Cheyenne Alvarado with [facility] in [place] called to state they do not accept referrals where Medicaid is the primary insurance only the secondary insurance. She stated she has already called the patient and told her we would be referring her elsewhere for her physical therapy.

## 2024-07-06 NOTE — Patient Instructions (Signed)
 I will send in a prescription for physical therapy for you-  the office will cal and set the appointment up for you.  If you have not heard from them by Monday, please give us  a call back.  Plantar Fasciitis (Heel Spur Syndrome) with Rehab The plantar fascia is a fibrous, ligament-like, soft-tissue structure that spans the bottom of the foot. Plantar fasciitis is a condition that causes pain in the foot due to inflammation of the tissue. SYMPTOMS  Pain and tenderness on the underneath side of the foot. Pain that worsens with standing or walking. CAUSES  Plantar fasciitis is caused by irritation and injury to the plantar fascia on the underneath side of the foot. Common mechanisms of injury include: Direct trauma to bottom of the foot. Damage to a small nerve that runs under the foot where the main fascia attaches to the heel bone. Stress placed on the plantar fascia due to any mild increased activity or injury RISK INCREASES WITH:  Obesity. Poor strength and flexibility. Improperly fitted shoes. Tight calf muscles. Flat feet. Failure to warm-up properly before activity.  PREVENTION Warm up and stretch properly before activity. Strength, flexibility Maintain a health body weight. Avoid stress on the plantar fascia. Wear properly fitted shoes, including arch supports for individuals who have flat feet. PROGNOSIS  If treated properly, then the symptoms of plantar fasciitis usually resolve without surgery. However, occasionally surgery is necessary. RELATED COMPLICATIONS  Recurrent symptoms that may result in a chronic condition. Problems of the lower back that are caused by compensating for the injury, such as limping. Pain or weakness of the foot during push-off following surgery. Chronic inflammation, scarring, and partial or complete fascia tear, occurring more often from repeated injections. TREATMENT  Treatment initially involves the use of ice and medication to help reduce pain  and inflammation. The use of strengthening and stretching exercises may help reduce pain with activity, especially stretches of the Achilles tendon.  Your caregiver may recommend that you use arch supports to help reduce stress on the plantar fascia. Often, corticosteroid injections are given to reduce inflammation. If symptoms persist for greater than 6 months despite non-surgical (conservative), then surgery may be recommended.  MEDICATION  If pain medication is necessary, then nonsteroidal anti-inflammatory medications, such as aspirin and ibuprofen , or other minor pain relievers, such as acetaminophen , are often recommended. Corticosteroid injections may be given by your caregiver.  HEAT AND COLD Cold treatment (icing) relieves pain and reduces inflammation. Cold treatment should be applied for 10 to 15 minutes every 2 to 3 hours for inflammation and pain and immediately after any activity that aggravates your symptoms. Use ice packs or massage the area with a piece of ice (ice massage). Heat treatment may be used prior to performing the stretching and strengthening activities prescribed by your caregiver, physical therapist, or athletic trainer. Use a heat pack or soak the injury in warm water. SEEK IMMEDIATE MEDICAL CARE IF: Treatment seems to offer no benefit, or the condition worsens. Any medications produce adverse side effects.  Perform this particular stretch daily first thing in the morning and before you go to bed. Hold for 30 seconds.    Try all the exercises and choose your favorite 3 to perform daily--  EXERCISES-- perform each exercise a total of 10-15 repetitions.  Hold for 30 seconds and perform 3 times per day   RANGE OF MOTION (ROM) AND STRETCHING EXERCISES - Plantar Fasciitis (Heel Spur Syndrome) These exercises may help you when beginning to rehabilitate  your injury.   While completing these exercises, remember:  Restoring tissue flexibility helps normal motion to return  to the joints. This allows healthier, less painful movement and activity. An effective stretch should be held for at least 30 seconds. A stretch should never be painful. You should only feel a gentle lengthening or release in the stretched tissue. RANGE OF MOTION - Toe Extension, Flexion Sit with your right / left leg crossed over your opposite knee. Grasp your toes and gently pull them back toward the top of your foot. You should feel a stretch on the bottom of your toes and/or foot. Hold this stretch for __________ seconds. Now, gently pull your toes toward the bottom of your foot. You should feel a stretch on the top of your toes and or foot. Hold this stretch for __________ seconds. Repeat __________ times. Complete this stretch __________ times per day.  RANGE OF MOTION - Ankle Dorsiflexion, Active Assisted Remove shoes and sit on a chair that is preferably not on a carpeted surface. Place right / left foot under knee. Extend your opposite leg for support. Keeping your heel down, slide your right / left foot back toward the chair until you feel a stretch at your ankle or calf. If you do not feel a stretch, slide your bottom forward to the edge of the chair, while still keeping your heel down. Hold this stretch for __________ seconds. Repeat __________ times. Complete this stretch __________ times per day.  STRETCH  Gastroc, Standing Place hands on wall. Extend right / left leg, keeping the front knee somewhat bent. Slightly point your toes inward on your back foot. Keeping your right / left heel on the floor and your knee straight, shift your weight toward the wall, not allowing your back to arch. You should feel a gentle stretch in the right / left calf. Hold this position for __________ seconds. Repeat __________ times. Complete this stretch __________ times per day. STRETCH  Soleus, Standing Place hands on wall. Extend right / left leg, keeping the other knee somewhat bent. Slightly  point your toes inward on your back foot. Keep your right / left heel on the floor, bend your back knee, and slightly shift your weight over the back leg so that you feel a gentle stretch deep in your back calf. Hold this position for __________ seconds. Repeat __________ times. Complete this stretch __________ times per day. STRETCH  Gastrocsoleus, Standing  Note: This exercise can place a lot of stress on your foot and ankle. Please complete this exercise only if specifically instructed by your caregiver.  Place the ball of your right / left foot on a step, keeping your other foot firmly on the same step. Hold on to the wall or a rail for balance. Slowly lift your other foot, allowing your body weight to press your heel down over the edge of the step. You should feel a stretch in your right / left calf. Hold this position for __________ seconds. Repeat this exercise with a slight bend in your right / left knee. Repeat __________ times. Complete this stretch __________ times per day.  STRENGTHENING EXERCISES - Plantar Fasciitis (Heel Spur Syndrome)  These exercises may help you when beginning to rehabilitate your injury. They may resolve your symptoms with or without further involvement from your physician, physical therapist or athletic trainer. While completing these exercises, remember:  Muscles can gain both the endurance and the strength needed for everyday activities through controlled exercises. Complete these exercises  as instructed by your physician, physical therapist or athletic trainer. Progress the resistance and repetitions only as guided.

## 2024-07-06 NOTE — Telephone Encounter (Signed)
 Sent to [facility], [place] [address] [place], [place] [address]  Phone [phone] Fax [phone]

## 2024-07-06 NOTE — Progress Notes (Signed)
 Subjective: Cheyenne Alvarado is a 38 y.o. female patient presents to office with complaint of moderate heel pain on bilateral heels-  right greater than left.  She relates she has experienced foot pan for most of her life.  She states she has tried icing, stretching, shoe changes with some improvement.   Patient admits to post static dyskinesia for several months in duration.  Denies any other pedal complaints.   Patient Active Problem List   Diagnosis Date Noted   Plantar fasciitis 07/02/2024   Obesity, Class II, BMI 35-39.9 06/28/2024   Acute serous otitis media of left ear 12/12/2021   Mixed conductive and sensorineural hearing loss of both ears 11/02/2021   ETD (Eustachian tube dysfunction), bilateral 11/02/2021   Otorrhea of left ear 12/04/2020   Central perforation of tympanic membrane, left ear 11/08/2020   Bilateral serous otitis media 11/01/2020   Uterine scar from previous surgery affecting pregnancy 10/21/2016   Attention deficit hyperactivity disorder 04/09/2016    Current Outpatient Medications on File Prior to Visit  Medication Sig Dispense Refill   phentermine  (ADIPEX-P ) 37.5 MG tablet Take 0.5-1 tablets (18.75-37.5 mg total) by mouth daily before breakfast. 30 tablet 2   No current facility-administered medications on file prior to visit.    Allergies  Allergen Reactions   Erythromycin Nausea Only    Objective: Physical Exam General: The patient is alert and oriented x3 in no acute distress.  Dermatology: Skin is warm, dry and supple bilateral lower extremities. Nails 1-10 are normal. There is no erythema, edema, no eccymosis, no open lesions present. Integument is otherwise unremarkable.  Vascular: Dorsalis Pedis pulse and Posterior Tibial pulse are 2/4 bilateral. Capillary fill time is immediate to all digits.  Neurological: Grossly intact to light touch with an achilles reflex of +2/5 and a  negative Tinel's sign bilateral.  Musculoskeletal: Tenderness to  palpation at the medial calcaneal tubercale and through the insertion of the plantar fascia on the right greater than left foot. No pain with compression of calcaneus bilateral. No pain with tuning fork to calcaneus bilateral. No pain with calf compression bilateral. There is decreased Ankle joint range of motion bilateral. All other joints range of motion within normal limits bilateral. Strength 5/5 in all groups bilateral.   Xray, right foot   Normal osseous mineralization. Joint spaces preserved. No fracture/dislocation/boney destruction. no calcaneal spur present with mild thickening of plantar fascia. No other soft tissue abnormalities or radiopaque foreign bodies.   Xray, left foot   Normal osseous mineralization. Joint spaces preserved. No fracture/dislocation/boney destruction. no calcaneal spur present with mild thickening of plantar fascia. No other soft tissue abnormalities or radiopaque foreign bodies.   Assessment and Plan:   ICD-10-CM   1. Plantar fasciitis, bilateral  M72.2 DG Foot Complete Left    DG Foot Complete Right    2. Achilles tendonitis, bilateral  M76.61    M76.62       -Complete examination performed.  -Xrays reviewed - Discussed etiology and pathology of plantar fasciitis along with conservative approach to treatment.   -After oral consent and aseptic prep, injected a mixture containing 20mg  Kenalog and 5mg  marcaine  plain was infiltrated into the area of maximal tenderness of the Bilateral heels. Post-injection care discussed with patient.  -Rx Meloxicam  to start for 2 weeks and taper was advised after 2 weeks.  -Recommended good supportive shoes and advised use of OTC insert.  -Explained and dispensed to patient daily stretching exercises.. -Recommended physical therapy for the plantar fasciitis  and tightness in the posterior extremity musculature.  Physical therapy is medically necessary to assist in rehabilitation of this concern.    She lives in [place] so  I will refer her to [facility] physical therapy in [place]-  if she has any problems being seen, she will call.  -Patient to return to office in 4 weeks if symptoms worsen or fail to improve.

## 2024-07-06 NOTE — Telephone Encounter (Signed)
 Spoke to patient will contact insurance company and inform us  on where to send referral.

## 2024-08-04 ENCOUNTER — Telehealth: Payer: Self-pay

## 2024-08-04 NOTE — Telephone Encounter (Signed)
 Called and spoke to patient. She is now scheduled with Dr.Evans on 08/30/24 for follow up.

## 2024-08-30 ENCOUNTER — Ambulatory Visit

## 2024-08-30 ENCOUNTER — Ambulatory Visit: Admitting: Podiatry

## 2024-09-01 ENCOUNTER — Ambulatory Visit: Admitting: Podiatry

## 2024-09-01 ENCOUNTER — Encounter: Payer: Self-pay | Admitting: Podiatry

## 2024-09-01 VITALS — Ht 67.0 in | Wt 245.0 lb

## 2024-09-01 DIAGNOSIS — M722 Plantar fascial fibromatosis: Secondary | ICD-10-CM | POA: Diagnosis not present

## 2024-09-01 NOTE — Progress Notes (Signed)
   Chief Complaint  Patient presents with   Plantar Fasciitis    Pt is here to f/u on bilateral foot pain due to plantar fasciitis, she states that she has started physical therapy that hs been helping still some pain in the foot but not as bad as before.    Subjective: 38 y.o. female presenting for follow up evaluation of bilateral plantar fasciitis.  She has been going to physical therapy which has helped.  Presenting for further treatment and evaluation  Past Medical History:  Diagnosis Date   ADHD (attention deficit hyperactivity disorder)    Congenital hearing disorder of both ears    Depression    Past Surgical History:  Procedure Laterality Date   CESAREAN SECTION     KIDNEY SURGERY     Allergies  Allergen Reactions   Erythromycin Nausea Only   Objective: Physical Exam General: The patient is alert and oriented x3 in no acute distress.  Dermatology: Skin is warm, dry and supple bilateral lower extremities. Negative for open lesions or macerations bilateral.   Vascular: Dorsalis Pedis and Posterior Tibial pulses palpable bilateral.  Capillary fill time is immediate to all digits.  Neurological: Grossly intact via light touch  Musculoskeletal: Tenderness to palpation to the plantar aspect of the bilateral heels along the plantar fascia. All other joints range of motion within normal limits bilateral. Strength 5/5 in all groups bilateral.   Radiographic exam B/L feet 07/06/2024: Normal osseous mineralization. Joint spaces preserved. No fracture/dislocation/boney destruction. No other soft tissue abnormalities or radiopaque foreign bodies.  Impression: Negative  Assessment: 1. plantar fasciitis bilateral feet 2.  Achilles tendinitis bilateral  Plan of Care:  -Patient evaluated. Xrays reviewed.   -Continue meloxicam  15 mg daily -Continue physical therapy at [facility] in [place] -Refrain from going barefoot.  Recommend good supportive tennis shoes and  sneakers. -Return to clinic PRN  Thresa EMERSON Sar, DPM [facility]  Dr. Thresa EMERSON Sar, DPM    [address] [place], [place] [address]                Office [phone]  Fax [phone]

## 2024-09-07 MED ORDER — BETAMETHASONE SOD PHOS & ACET 6 (3-3) MG/ML IJ SUSP
3.0000 mg | Freq: Once | INTRAMUSCULAR | Status: AC
  Administered 2024-09-07: 3 mg via INTRA_ARTICULAR

## 2024-10-26 ENCOUNTER — Ambulatory Visit

## 2024-10-26 VITALS — BP 111/80 | HR 79 | Ht 67.0 in | Wt 234.1 lb

## 2024-10-26 DIAGNOSIS — Z0000 Encounter for general adult medical examination without abnormal findings: Secondary | ICD-10-CM

## 2024-10-26 DIAGNOSIS — Z0001 Encounter for general adult medical examination with abnormal findings: Secondary | ICD-10-CM | POA: Diagnosis not present

## 2024-10-26 DIAGNOSIS — M79671 Pain in right foot: Secondary | ICD-10-CM | POA: Diagnosis not present

## 2024-10-26 DIAGNOSIS — M79672 Pain in left foot: Secondary | ICD-10-CM

## 2024-10-26 DIAGNOSIS — E66812 Obesity, class 2: Secondary | ICD-10-CM | POA: Diagnosis not present

## 2024-10-26 MED ORDER — PHENTERMINE HCL 37.5 MG PO TABS: 18.2500 mg | ORAL_TABLET | Freq: Every day | ORAL | 2 refills | Status: AC

## 2024-10-26 NOTE — Progress Notes (Signed)
 "  Complete physical exam  Patient: Cheyenne Alvarado   DOB: [birth year]   38 y.o. Female  MRN: [id number]  Subjective:    Chief Complaint  Patient presents with   Annual Exam    Physial pt also requesting blood work due to her feet and the last time she came in she has already went to the podiatrist and pt and its started to get bettter and fell of an there pain in now back     Cheyenne Alvarado is a 38 y.o. female who presents today for a complete physical exam. She reports consuming a general diet. Exercise is limited by orthopedic condition(s): bilat foot pain. She generally feels well. She reports sleeping well. She does have additional problems to discuss today.    Most recent fall risk assessment:     No data to display           Most recent depression screenings:    10/26/2024    9:43 AM 06/28/2024    8:39 AM  PHQ 2/9 Scores  PHQ - 2 Score 0   PHQ- 9 Score 0   Exception Documentation  Patient refusal      Patient Active Problem List   Diagnosis Date Noted   Foot pain, bilateral 10/28/2024   Plantar fasciitis 07/02/2024   Obesity, Class II, BMI 35-39.9 06/28/2024   Acute serous otitis media of left ear 12/12/2021   Mixed conductive and sensorineural hearing loss of both ears 11/02/2021   ETD (Eustachian tube dysfunction), bilateral 11/02/2021   Otorrhea of left ear 12/04/2020   Central perforation of tympanic membrane, left ear 11/08/2020   Bilateral serous otitis media 11/01/2020   Uterine scar from previous surgery affecting pregnancy 10/21/2016   Attention deficit hyperactivity disorder 04/09/2016      Patient Care Team: Bevely Doffing, FNP as PCP - General (Family Medicine)   Show/hide medication list[1]  ROS     Objective:     BP 111/80   Pulse 79   Ht 5' 7 (1.702 m)   Wt 234 lb 1.9 oz (106.2 kg)   SpO2 98%   BMI 36.67 kg/m  BP Readings from Last 3 Encounters:  10/26/24 111/80  06/28/24 117/82  12/30/23 118/76   Wt Readings from Last 3  Encounters:  10/26/24 234 lb 1.9 oz (106.2 kg)  09/01/24 245 lb (111.1 kg)  06/28/24 245 lb (111.1 kg)     Physical Exam Vitals and nursing note reviewed.  Constitutional:      Appearance: Normal appearance.  HENT:     Head: Normocephalic.     Right Ear: Tympanic membrane, ear canal and external ear normal.     Left Ear: Tympanic membrane, ear canal and external ear normal.     Nose: Nose normal.     Mouth/Throat:     Mouth: Mucous membranes are moist.     Pharynx: Oropharynx is clear.  Eyes:     Extraocular Movements: Extraocular movements intact.     Conjunctiva/sclera: Conjunctivae normal.     Pupils: Pupils are equal, round, and reactive to light.  Cardiovascular:     Rate and Rhythm: Normal rate and regular rhythm.  Pulmonary:     Effort: Pulmonary effort is normal.     Breath sounds: Normal breath sounds.  Abdominal:     General: Abdomen is flat. Bowel sounds are normal.     Palpations: Abdomen is soft.  Musculoskeletal:        General: Normal range of motion.  Cervical back: Normal range of motion and neck supple.  Skin:    General: Skin is warm and dry.  Neurological:     Mental Status: She is alert and oriented to person, place, and time.  Psychiatric:        Mood and Affect: Mood normal.        Thought Content: Thought content normal.           Assessment & Plan:    Routine Health Maintenance and Physical Exam  There is no immunization history for the selected administration types on file for this patient.  Health Maintenance  Topic Date Due   Hepatitis C Screening  Never done   DTaP/Tdap/Td (1 - Tdap) Never done   Hepatitis B Vaccines 19-59 Average Risk (1 of 3 - 19+ 3-dose series) Never done   HPV VACCINES (1 - 3-dose SCDM series) Never done   Cervical Cancer Screening (HPV/Pap Cotest)  Never done   Influenza Vaccine  06/04/2024   COVID-19 Vaccine (1 - 2025-26 season) Never done   HIV Screening  Completed   Pneumococcal Vaccine  Aged Out    Meningococcal B Vaccine  Aged Out    Discussed health benefits of physical activity, and encouraged her to engage in regular exercise appropriate for her age and condition.  Problem List Items Addressed This Visit       Other   Obesity, Class II, BMI 35-39.9 (Chronic)   Relevant Medications   phentermine  (ADIPEX-P ) 37.5 MG tablet   Foot pain, bilateral   Ongoing foot pain for years.  She has been evaluated by podiatrist and gone through PT for her foot pain.  Will add additional labs for further evaluation of foot pain.        Relevant Orders   B12 and Folate Panel (Completed)   Vitamin D  (25 hydroxy) (Completed)   Uric acid (Completed)   Other Visit Diagnoses       Encounter for preventative adult health care examination    -  Primary   unremarkable exam.  check fasting labs today.   Relevant Orders   CMP14+EGFR (Completed)   CBC (Completed)   Lipid Profile (Completed)   HgB A1c (Completed)   TSH + free T4 (Completed)   B12 and Folate Panel (Completed)   Vitamin D  (25 hydroxy) (Completed)   Uric acid (Completed)      No follow-ups on file.    Leita Longs, FNP      [1]  Outpatient Medications Prior to Visit  Medication Sig   meloxicam  (MOBIC ) 15 MG tablet Take 1 tablet (15 mg total) by mouth daily. Take daily for 10-14 days, then start to taper off.  May take as needed thereafter   [DISCONTINUED] phentermine  (ADIPEX-P ) 37.5 MG tablet Take 0.5-1 tablets (18.75-37.5 mg total) by mouth daily before breakfast.   No facility-administered medications prior to visit.   "

## 2024-10-27 LAB — CMP14+EGFR
ALT: 15 IU/L (ref 0–32)
AST: 22 IU/L (ref 0–40)
Albumin: 4.4 g/dL (ref 3.9–4.9)
Alkaline Phosphatase: 84 IU/L (ref 41–116)
BUN/Creatinine Ratio: 12 (ref 9–23)
BUN: 10 mg/dL (ref 6–20)
Bilirubin Total: 0.3 mg/dL (ref 0.0–1.2)
CO2: 23 mmol/L (ref 20–29)
Calcium: 9.6 mg/dL (ref 8.7–10.2)
Chloride: 101 mmol/L (ref 96–106)
Creatinine, Ser: 0.86 mg/dL (ref 0.57–1.00)
Globulin, Total: 2.8 g/dL (ref 1.5–4.5)
Glucose: 95 mg/dL (ref 70–99)
Potassium: 4.6 mmol/L (ref 3.5–5.2)
Sodium: 138 mmol/L (ref 134–144)
Total Protein: 7.2 g/dL (ref 6.0–8.5)
eGFR: 89 mL/min/1.73

## 2024-10-27 LAB — URIC ACID: Uric Acid: 4.8 mg/dL (ref 2.6–6.2)

## 2024-10-27 LAB — TSH+FREE T4
Free T4: 1.38 ng/dL (ref 0.82–1.77)
TSH: 2.59 u[IU]/mL (ref 0.450–4.500)

## 2024-10-27 LAB — B12 AND FOLATE PANEL
Folate: 8.2 ng/mL
Vitamin B-12: 632 pg/mL (ref 232–1245)

## 2024-10-27 LAB — LIPID PANEL
Chol/HDL Ratio: 3.2 ratio (ref 0.0–4.4)
Cholesterol, Total: 153 mg/dL (ref 100–199)
HDL: 48 mg/dL
LDL Chol Calc (NIH): 87 mg/dL (ref 0–99)
Triglycerides: 97 mg/dL (ref 0–149)
VLDL Cholesterol Cal: 18 mg/dL (ref 5–40)

## 2024-10-27 LAB — CBC
Hematocrit: 42.3 % (ref 34.0–46.6)
Hemoglobin: 13.3 g/dL (ref 11.1–15.9)
MCH: 26.7 pg (ref 26.6–33.0)
MCHC: 31.4 g/dL — ABNORMAL LOW (ref 31.5–35.7)
MCV: 85 fL (ref 79–97)
Platelets: 355 x10E3/uL (ref 150–450)
RBC: 4.98 x10E6/uL (ref 3.77–5.28)
RDW: 13.5 % (ref 11.7–15.4)
WBC: 6.9 x10E3/uL (ref 3.4–10.8)

## 2024-10-27 LAB — HEMOGLOBIN A1C
Est. average glucose Bld gHb Est-mCnc: 111 mg/dL
Hgb A1c MFr Bld: 5.5 % (ref 4.8–5.6)

## 2024-10-27 LAB — VITAMIN D 25 HYDROXY (VIT D DEFICIENCY, FRACTURES): Vit D, 25-Hydroxy: 31.6 ng/mL (ref 30.0–100.0)

## 2024-10-28 ENCOUNTER — Ambulatory Visit: Payer: Self-pay

## 2024-10-28 DIAGNOSIS — M79671 Pain in right foot: Secondary | ICD-10-CM | POA: Insufficient documentation

## 2024-10-28 NOTE — Assessment & Plan Note (Signed)
 Ongoing foot pain for years.  She has been evaluated by podiatrist and gone through PT for her foot pain.  Will add additional labs for further evaluation of foot pain.
